# Patient Record
Sex: Female | Born: 1960 | Race: White | Hispanic: No | Marital: Single | Smoking: Former smoker
Health system: Southern US, Community
[De-identification: ages and names within clinical notes are randomized; demographics above are authoritative.]

## PROBLEM LIST (undated history)

## (undated) DIAGNOSIS — F329 Major depressive disorder, single episode, unspecified: Secondary | ICD-10-CM

## (undated) DIAGNOSIS — I10 Essential (primary) hypertension: Secondary | ICD-10-CM

## (undated) DIAGNOSIS — F1910 Other psychoactive substance abuse, uncomplicated: Secondary | ICD-10-CM

## (undated) DIAGNOSIS — F32A Depression, unspecified: Secondary | ICD-10-CM

## (undated) DIAGNOSIS — T7840XA Allergy, unspecified, initial encounter: Secondary | ICD-10-CM

## (undated) DIAGNOSIS — K219 Gastro-esophageal reflux disease without esophagitis: Secondary | ICD-10-CM

## (undated) DIAGNOSIS — F419 Anxiety disorder, unspecified: Secondary | ICD-10-CM

## (undated) DIAGNOSIS — M1990 Unspecified osteoarthritis, unspecified site: Secondary | ICD-10-CM

## (undated) DIAGNOSIS — C801 Malignant (primary) neoplasm, unspecified: Secondary | ICD-10-CM

## (undated) DIAGNOSIS — Z5189 Encounter for other specified aftercare: Secondary | ICD-10-CM

## (undated) DIAGNOSIS — B1920 Unspecified viral hepatitis C without hepatic coma: Secondary | ICD-10-CM

## (undated) HISTORY — DX: Anxiety disorder, unspecified: F41.9

## (undated) HISTORY — DX: Unspecified osteoarthritis, unspecified site: M19.90

## (undated) HISTORY — DX: Unspecified viral hepatitis C without hepatic coma: B19.20

## (undated) HISTORY — DX: Major depressive disorder, single episode, unspecified: F32.9

## (undated) HISTORY — DX: Allergy, unspecified, initial encounter: T78.40XA

## (undated) HISTORY — DX: Other psychoactive substance abuse, uncomplicated: F19.10

## (undated) HISTORY — DX: Encounter for other specified aftercare: Z51.89

## (undated) HISTORY — PX: CHOLECYSTECTOMY: SHX55

## (undated) HISTORY — DX: Malignant (primary) neoplasm, unspecified: C80.1

## (undated) HISTORY — DX: Gastro-esophageal reflux disease without esophagitis: K21.9

## (undated) HISTORY — DX: Depression, unspecified: F32.A

---

## 1992-08-17 DIAGNOSIS — C801 Malignant (primary) neoplasm, unspecified: Secondary | ICD-10-CM

## 1992-08-17 HISTORY — DX: Malignant (primary) neoplasm, unspecified: C80.1

## 2015-03-27 ENCOUNTER — Telehealth: Payer: Self-pay | Admitting: Family Medicine

## 2015-04-15 ENCOUNTER — Ambulatory Visit: Payer: Self-pay | Admitting: Family Medicine

## 2015-04-17 ENCOUNTER — Encounter (INDEPENDENT_AMBULATORY_CARE_PROVIDER_SITE_OTHER): Payer: Self-pay | Admitting: *Deleted

## 2015-04-17 ENCOUNTER — Encounter: Payer: Self-pay | Admitting: Family Medicine

## 2015-04-17 ENCOUNTER — Ambulatory Visit (INDEPENDENT_AMBULATORY_CARE_PROVIDER_SITE_OTHER): Payer: Medicaid Other | Admitting: Family Medicine

## 2015-04-17 VITALS — BP 186/93 | HR 69 | Temp 98.1°F | Ht 64.0 in | Wt 177.2 lb

## 2015-04-17 DIAGNOSIS — Z0000 Encounter for general adult medical examination without abnormal findings: Secondary | ICD-10-CM | POA: Diagnosis not present

## 2015-04-17 DIAGNOSIS — I10 Essential (primary) hypertension: Secondary | ICD-10-CM | POA: Diagnosis not present

## 2015-04-17 DIAGNOSIS — Z1211 Encounter for screening for malignant neoplasm of colon: Secondary | ICD-10-CM | POA: Diagnosis not present

## 2015-04-17 DIAGNOSIS — F3170 Bipolar disorder, currently in remission, most recent episode unspecified: Secondary | ICD-10-CM | POA: Diagnosis not present

## 2015-04-17 DIAGNOSIS — Z1231 Encounter for screening mammogram for malignant neoplasm of breast: Secondary | ICD-10-CM | POA: Insufficient documentation

## 2015-04-17 DIAGNOSIS — B86 Scabies: Secondary | ICD-10-CM

## 2015-04-17 DIAGNOSIS — Z13220 Encounter for screening for lipoid disorders: Secondary | ICD-10-CM | POA: Diagnosis not present

## 2015-04-17 DIAGNOSIS — Z01419 Encounter for gynecological examination (general) (routine) without abnormal findings: Secondary | ICD-10-CM | POA: Diagnosis not present

## 2015-04-17 DIAGNOSIS — F319 Bipolar disorder, unspecified: Secondary | ICD-10-CM | POA: Insufficient documentation

## 2015-04-17 MED ORDER — TRIAMCINOLONE ACETONIDE 0.1 % EX CREA: 1.0000 "application " | TOPICAL_CREAM | Freq: Two times a day (BID) | CUTANEOUS | Status: DC

## 2015-04-17 MED ORDER — HYDROCHLOROTHIAZIDE 25 MG PO TABS: 25.0000 mg | ORAL_TABLET | Freq: Every day | ORAL | Status: DC

## 2015-04-17 MED ORDER — PERMETHRIN 5 % EX CREA: 1.0000 "application " | TOPICAL_CREAM | Freq: Once | CUTANEOUS | Status: DC

## 2015-04-17 NOTE — Patient Instructions (Signed)

## 2015-04-17 NOTE — Assessment & Plan Note (Signed)
Patient has multiple small scabbed over lesions on her body that are very pruritic. She has had scabies before and would like treatment for such. This is not a typical scabies presentation based on the location but will treat and then also recommended her to treat her house for bedbugs.

## 2015-04-17 NOTE — Assessment & Plan Note (Addendum)
Patient has manic depression and anxiety. Gave her number for behavioral science to go and see them. Not currently manic and no current thoughts of depression or suicide.

## 2015-04-17 NOTE — Assessment & Plan Note (Signed)
Patient coming for routine well woman exam. She has had atypical Paps in the past but none of her recent ones have been atypical. She also had cryotherapy and cone biopsy in the past but this was when she was late 83.

## 2015-04-17 NOTE — Progress Notes (Signed)
BP 186/93 mmHg  Pulse 69  Temp(Src) 98.1 F (36.7 C) (Oral)  Ht '5\' 4"'  (1.626 m)  Wt 177 lb 3.2 oz (80.377 kg)  BMI 30.40 kg/m2   Subjective:    Patient ID: Alicia Harmon, female    DOB: 04/12/1961, 54 y.o.   MRN: [id number]  HPI: Alicia Harmon is a 55 y.o. female presenting on 04/17/2015 for Annual Exam   HPI Annual well exam Patient presents today new patient coming today for her annual physical exam. She has had multiple normal Paps in a row but did have cold cone biopsy and cryo-when she was younger. She has not had any abnormality since that point.  Rash Patient has rash that appears like multiple small excoriated spots or bites on her arms and buttocks and legs. She has had treatment once before for scabies for this and it improved but it is since worsened. She does not know if she has bug bites or anything along those lines.  Hypertension Patient has elevated blood pressure today. She has been on blood pressure medication before just the lisinopril and she had a reaction to it of feeling sick to her stomach. Her blood pressure is 186/93. She denies any headaches, chest pain, shortness of breath, visual disturbances, or numbness or weakness.  Mood disorder Patient has been diagnosed with manic depression previously which runs in her family. She is not currently on any medications and denies any major issues with mania or depression. She denies any troubles with the law, relationship issues or any other major issues from this mood disorder. She does say that she does have some anxiety and would like to talk about that.  Relevant past medical, surgical, family and social history reviewed and updated as indicated. Interim medical history since our last visit reviewed. Allergies and medications reviewed and updated.  Review of Systems  Constitutional: Negative for fever and chills.  HENT: Negative for congestion, ear discharge and ear pain.   Eyes: Negative for pain,  redness and visual disturbance.  Respiratory: Negative for chest tightness, shortness of breath and wheezing.   Cardiovascular: Negative for chest pain, palpitations and leg swelling.  Genitourinary: Negative for dysuria and difficulty urinating.  Musculoskeletal: Negative for back pain and gait problem.  Skin: Positive for rash.  Neurological: Negative for dizziness, speech difficulty, weakness, light-headedness, numbness and headaches.  Psychiatric/Behavioral: Negative for suicidal ideas, behavioral problems and agitation. The patient is nervous/anxious.   All other systems reviewed and are negative.   Per HPI unless specifically indicated above     Medication List       This list is accurate as of: 04/17/15  1:20 PM.  Always use your most recent med list.               hydrochlorothiazide 25 MG tablet  Commonly known as:  HYDRODIURIL  Take 1 tablet (25 mg total) by mouth daily.     permethrin 5 % cream  Commonly known as:  ACTICIN  Apply 1 application topically once.     triamcinolone cream 0.1 %  Commonly known as:  KENALOG  Apply 1 application topically 2 (two) times daily.           Objective:    BP 186/93 mmHg  Pulse 69  Temp(Src) 98.1 F (36.7 C) (Oral)  Ht '5\' 4"'  (1.626 m)  Wt 177 lb 3.2 oz (80.377 kg)  BMI 30.40 kg/m2  Wt Readings from Last 3 Encounters:  04/17/15 177 lb 3.2 oz (80.377  kg)    Physical Exam  Constitutional: She is oriented to person, place, and time. She appears well-developed and well-nourished. No distress.  Eyes: Conjunctivae and EOM are normal. Pupils are equal, round, and reactive to light.  Neck: No thyromegaly present.  Cardiovascular: Normal rate, regular rhythm, normal heart sounds and intact distal pulses.   No murmur heard. Pulmonary/Chest: Effort normal and breath sounds normal. No respiratory distress. She has no wheezes.  Musculoskeletal: Normal range of motion. She exhibits no edema or tenderness.  Lymphadenopathy:     She has no cervical adenopathy.  Neurological: She is alert and oriented to person, place, and time. Coordination normal.  Skin: Skin is warm and dry. Rash noted. She is not diaphoretic. No erythema.  Psychiatric: Her speech is normal and behavior is normal. Judgment and thought content normal. Her mood appears anxious. Her affect is not blunt, not labile and not inappropriate. She does not exhibit a depressed mood.  Vitals reviewed.   No results found for this or any previous visit.    Assessment & Plan:   Problem List Items Addressed This Visit      Cardiovascular and Mediastinum   Essential hypertension - Primary    Has had hypertension before and minimal medications for it but is not currently on any medication. Blood pressure is very elevated today and will start hydrochlorothiazide and do some screening labs.      Relevant Medications   hydrochlorothiazide (HYDRODIURIL) 25 MG tablet   Other Relevant Orders   BMP8+EGFR     Musculoskeletal and Integument   Scabies    Patient has multiple small scabbed over lesions on her body that are very pruritic. She has had scabies before and would like treatment for such. This is not a typical scabies presentation based on the location but will treat and then also recommended her to treat her house for bedbugs.      Relevant Medications   permethrin (ACTICIN) 5 % cream   triamcinolone cream (KENALOG) 0.1 %     Other   Well woman exam with routine gynecological exam    Patient coming for routine well woman exam. She has had atypical Paps in the past but none of her recent ones have been atypical. She also had cryotherapy and cone biopsy in the past but this was when she was late 85.      Relevant Orders   Pap IG w/ reflex to HPV when ASC-U   Manic depression    Patient has manic depression and anxiety. Gave her number for behavioral science to go and see them. Not currently manic and no current thoughts of depression or suicide.        Encounter for screening mammogram for breast cancer   Relevant Orders   MM Digital Screening   Colon cancer screening   Relevant Orders   Ambulatory referral to Gastroenterology    Other Visit Diagnoses    Screening for lipoid disorders        Relevant Orders    Lipid panel        Follow up plan: Return in about 4 weeks (around 05/15/2015), or if symptoms worsen or fail to improve.  Caryl Pina, MD [facility] 04/17/2015, 1:20 PM

## 2015-04-17 NOTE — Assessment & Plan Note (Signed)
Has had hypertension before and minimal medications for it but is not currently on any medication. Blood pressure is very elevated today and will start hydrochlorothiazide and do some screening labs.

## 2015-04-18 ENCOUNTER — Other Ambulatory Visit: Payer: Self-pay | Admitting: Family Medicine

## 2015-04-18 DIAGNOSIS — Z1231 Encounter for screening mammogram for malignant neoplasm of breast: Secondary | ICD-10-CM

## 2015-04-18 LAB — BMP8+EGFR
BUN/Creatinine Ratio: 19 (ref 9–23)
BUN: 15 mg/dL (ref 6–24)
CALCIUM: 9.8 mg/dL (ref 8.7–10.2)
CHLORIDE: 103 mmol/L (ref 97–108)
CO2: 23 mmol/L (ref 18–29)
Creatinine, Ser: 0.79 mg/dL (ref 0.57–1.00)
GFR calc Af Amer: 100 mL/min/{1.73_m2} (ref >59)
GFR, EST NON AFRICAN AMERICAN: 86 mL/min/{1.73_m2} (ref >59)
GLUCOSE: 97 mg/dL (ref 65–99)
POTASSIUM: 5 mmol/L (ref 3.5–5.2)
SODIUM: 140 mmol/L (ref 134–144)

## 2015-04-18 LAB — LIPID PANEL
CHOL/HDL RATIO: 3.3 ratio (ref 0.0–4.4)
CHOLESTEROL TOTAL: 166 mg/dL (ref 100–199)
HDL: 51 mg/dL (ref >39)
LDL Calculated: 97 mg/dL (ref 0–99)
TRIGLYCERIDES: 91 mg/dL (ref 0–149)
VLDL Cholesterol Cal: 18 mg/dL (ref 5–40)

## 2015-04-20 LAB — PAP IG W/ RFLX HPV ASCU: PAP Smear Comment: 0

## 2015-04-25 ENCOUNTER — Ambulatory Visit (HOSPITAL_COMMUNITY): Payer: Self-pay

## 2015-05-06 ENCOUNTER — Ambulatory Visit (HOSPITAL_COMMUNITY): Payer: Self-pay

## 2015-05-06 ENCOUNTER — Ambulatory Visit (HOSPITAL_COMMUNITY)
Admission: RE | Admit: 2015-05-06 | Discharge: 2015-05-06 | Disposition: A | Discharge status: Home / Self Care | Payer: Medicaid Other | Source: Ambulatory Visit | Attending: Family Medicine | Admitting: Family Medicine

## 2015-05-06 DIAGNOSIS — Z1231 Encounter for screening mammogram for malignant neoplasm of breast: Secondary | ICD-10-CM | POA: Insufficient documentation

## 2015-05-15 ENCOUNTER — Ambulatory Visit (INDEPENDENT_AMBULATORY_CARE_PROVIDER_SITE_OTHER): Payer: Medicaid Other | Admitting: Family Medicine

## 2015-05-15 ENCOUNTER — Encounter: Payer: Self-pay | Admitting: Family Medicine

## 2015-05-15 VITALS — BP 146/97 | HR 65 | Temp 97.8°F | Ht 64.0 in | Wt 174.8 lb

## 2015-05-15 DIAGNOSIS — W57XXXA Bitten or stung by nonvenomous insect and other nonvenomous arthropods, initial encounter: Secondary | ICD-10-CM

## 2015-05-15 DIAGNOSIS — I10 Essential (primary) hypertension: Secondary | ICD-10-CM | POA: Diagnosis not present

## 2015-05-15 DIAGNOSIS — S45302A Unspecified injury of superficial vein at shoulder and upper arm level, left arm, initial encounter: Secondary | ICD-10-CM | POA: Diagnosis not present

## 2015-05-15 MED ORDER — TRIAMCINOLONE ACETONIDE 0.025 % EX OINT: 1.0000 "application " | TOPICAL_OINTMENT | Freq: Two times a day (BID) | CUTANEOUS | Status: DC

## 2015-05-15 MED ORDER — AMLODIPINE BESYLATE 5 MG PO TABS: 5.0000 mg | ORAL_TABLET | Freq: Every day | ORAL | Status: DC

## 2015-05-15 NOTE — Progress Notes (Signed)
BP 146/97 mmHg  Pulse 65  Temp(Src) 97.8 F (36.6 C) (Oral)  Ht _0  (1.626 m)  Wt 174 lb 12.8 oz (79.289 kg)  BMI 29.99 kg/m2   Subjective:    Patient ID: Alicia Harmon, female    DOB: Jan 05, 1961, 54 y.o.   MRN: [id number]  HPI: Alicia Harmon is a 54 y.o. female presenting on 05/15/2015 for Hypertension and Rash   HPI Hypertension Patient comes in today for hypertension recheck. She is currently taking hydrochlorothiazide 25 mg without any side effects. Patient denies headaches, blurred vision, chest pains, shortness of breath, or weakness. Denies any side effects from medication and is content with current medication. Her blood pressure still not under control today and she is agreeable to taking an additional medication.  Bug bites Patient presents today's with still having issues with bug bites on her body. She has multiple over her shoulder on the left side anterior chest and abdomen box and her lower extremities as well and both arms as well. She has a lot of difficulty not scratching them. They are very pruritic. She denies any fevers or chills and thinks they are lice as she had her daughter living with her who has lived on the street for quite some time.  Relevant past medical, surgical, family and social history reviewed and updated as indicated. Interim medical history since our last visit reviewed. Allergies and medications reviewed and updated.  Review of Systems  Constitutional: Negative for fever and chills.  HENT: Negative for congestion, ear discharge and ear pain.   Eyes: Negative for redness and visual disturbance.  Respiratory: Negative for chest tightness and shortness of breath.   Cardiovascular: Negative for chest pain and leg swelling.  Genitourinary: Negative for dysuria and difficulty urinating.  Musculoskeletal: Negative for back pain and gait problem.  Skin: Positive for rash.  Neurological: Positive for headaches. Negative for dizziness,  weakness, light-headedness and numbness.  Psychiatric/Behavioral: Negative for behavioral problems and agitation.  All other systems reviewed and are negative.   Per HPI unless specifically indicated above     Medication List       This list is accurate as of: 05/15/15  1:21 PM.  Always use your most recent med list.               amLODipine 5 MG tablet  Commonly known as:  NORVASC  Take 1 tablet (5 mg total) by mouth daily.     hydrochlorothiazide 25 MG tablet  Commonly known as:  HYDRODIURIL  Take 1 tablet (25 mg total) by mouth daily.     triamcinolone 0.025 % ointment  Commonly known as:  KENALOG  Apply 1 application topically 2 (two) times daily.           Objective:    BP 146/97 mmHg  Pulse 65  Temp(Src) 97.8 F (36.6 C) (Oral)  Ht _1  (1.626 m)  Wt 174 lb 12.8 oz (79.289 kg)  BMI 29.99 kg/m2  Wt Readings from Last 3 Encounters:  05/15/15 174 lb 12.8 oz (79.289 kg)  04/17/15 177 lb 3.2 oz (80.377 kg)    Physical Exam  Constitutional: She is oriented to person, place, and time. She appears well-developed and well-nourished. No distress.  Eyes: Conjunctivae and EOM are normal. Pupils are equal, round, and reactive to light.  Neck: Neck supple. No thyromegaly present.  Cardiovascular: Normal rate, regular rhythm, normal heart sounds and intact distal pulses.   No murmur heard. Pulmonary/Chest: Effort normal and breath  sounds normal. No respiratory distress. She has no wheezes.  Musculoskeletal: Normal range of motion. She exhibits no edema or tenderness.  Lymphadenopathy:    She has no cervical adenopathy.  Neurological: She is alert and oriented to person, place, and time. Coordination normal.  Skin: Skin is warm and dry. Rash (multiple excoriated sites over her body especially on anterior chest arms and legs. Appear to have been bites for which she has scratched off., No signs of infection) noted. She is not diaphoretic.  Psychiatric: She has a normal  mood and affect. Her behavior is normal.  Vitals reviewed.   Results for orders placed or performed in visit on 04/17/15  [facility]  Result Value Ref Range   Glucose 97 65 - 99 mg/dL   BUN 15 6 - 24 mg/dL   Creatinine, Ser 0.79 0.57 - 1.00 mg/dL   GFR calc non Af Amer 86 >59 mL/min/1.73   GFR calc Af Amer 100 >59 mL/min/1.73   BUN/Creatinine Ratio 19 9 - 23   Sodium 140 134 - 144 mmol/L   Potassium 5.0 3.5 - 5.2 mmol/L   Chloride 103 97 - 108 mmol/L   CO2 23 18 - 29 mmol/L   Calcium 9.8 8.7 - 10.2 mg/dL  Lipid panel  Result Value Ref Range   Cholesterol, Total 166 100 - 199 mg/dL   Triglycerides 91 0 - 149 mg/dL   HDL 51 >39 mg/dL   VLDL Cholesterol Cal 18 5 - 40 mg/dL   LDL Calculated 97 0 - 99 mg/dL   Chol/HDL Ratio 3.3 0.0 - 4.4 ratio units  Pap IG w/ reflex to HPV when ASC-U  Result Value Ref Range   DIAGNOSIS: Comment    Specimen adequacy: Comment    CLINICIAN PROVIDED ICD10: Comment    Performed by: Comment    PAP SMEAR COMMENT .    Note: Comment    Test Methodology Comment    PAP REFLEX: Comment       Assessment & Plan:   Problem List Items Addressed This Visit      Cardiovascular and Mediastinum   Essential hypertension - Primary    Patient has blood pressure that is still not under control despite being on hydrochlorothiazide 25 mg. We will add Norvasc 5 mg and recheck in 1 month.      Relevant Medications   amLODipine (NORVASC) 5 MG tablet    Other Visit Diagnoses    Bug bites        Patient has multiple excoriated sites that used to be bug bites where she has scratched them and made them a lot bigger. We'll try anti-itch cream    Relevant Medications    triamcinolone (KENALOG) 0.025 % ointment        Follow up plan: Return in about 4 weeks (around 06/12/2015), or if symptoms worsen or fail to improve, for hypertension.  Caryl Pina, MD [facility] 05/15/2015, 1:21 PM

## 2015-05-15 NOTE — Assessment & Plan Note (Signed)
Patient has blood pressure that is still not under control despite being on hydrochlorothiazide 25 mg. We will add Norvasc 5 mg and recheck in 1 month.

## 2015-06-12 ENCOUNTER — Ambulatory Visit: Payer: Medicaid Other | Admitting: Family Medicine

## 2015-06-18 ENCOUNTER — Other Ambulatory Visit: Payer: Self-pay | Admitting: Family Medicine

## 2015-08-08 ENCOUNTER — Telehealth: Payer: Self-pay

## 2015-08-08 ENCOUNTER — Ambulatory Visit (INDEPENDENT_AMBULATORY_CARE_PROVIDER_SITE_OTHER): Payer: Medicaid Other | Admitting: Family Medicine

## 2015-08-08 ENCOUNTER — Encounter: Payer: Self-pay | Admitting: Family Medicine

## 2015-08-08 VITALS — BP 130/89 | HR 82 | Temp 97.8°F | Ht 64.0 in | Wt 186.0 lb

## 2015-08-08 DIAGNOSIS — R21 Rash and other nonspecific skin eruption: Secondary | ICD-10-CM

## 2015-08-08 DIAGNOSIS — Z1211 Encounter for screening for malignant neoplasm of colon: Secondary | ICD-10-CM

## 2015-08-08 DIAGNOSIS — I10 Essential (primary) hypertension: Secondary | ICD-10-CM

## 2015-08-08 DIAGNOSIS — Z202 Contact with and (suspected) exposure to infections with a predominantly sexual mode of transmission: Secondary | ICD-10-CM | POA: Diagnosis not present

## 2015-08-08 MED ORDER — HYDROCORTISONE VALERATE 0.2 % EX OINT: 1.0000 "application " | TOPICAL_OINTMENT | Freq: Two times a day (BID) | CUTANEOUS | Status: DC

## 2015-08-08 MED ORDER — HYDROCORTISONE 1 % EX OINT: 1.0000 "application " | TOPICAL_OINTMENT | Freq: Two times a day (BID) | CUTANEOUS | Status: DC

## 2015-08-08 NOTE — Assessment & Plan Note (Signed)
Monitor for now and consider diet controlled.
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
## 2015-08-08 NOTE — Telephone Encounter (Signed)
Pt notified of RX 

## 2015-08-08 NOTE — Telephone Encounter (Signed)
Medicaid non  Preferred Hydrocortisone  Preferred are fluticasone cream/ointment, hydrocortisone valerate cream/ointment or mometasone cream/ointment

## 2015-08-08 NOTE — Telephone Encounter (Signed)
Please send the hydrocortisone valerate ointment, same dosing. Caryl Pina, MD [facility] 08/08/2015, 1:04 PM

## 2015-08-08 NOTE — Addendum Note (Signed)
Addended by: Marin Olp on: 08/08/2015 02:27 PM   Modules accepted: Orders

## 2015-08-09 ENCOUNTER — Other Ambulatory Visit: Payer: Self-pay | Admitting: Family Medicine

## 2015-08-09 LAB — STD SCREEN (8)
HEP A IGM: NEGATIVE
HEP B S AG: NEGATIVE
HIV Screen 4th Generation wRfx: NONREACTIVE
HSV 1 Glycoprotein G Ab, IgG: 51.2 index — ABNORMAL HIGH (ref 0.00–0.90)
Hep B C IgM: NEGATIVE
Hep C Virus Ab: >11 s/co ratio — ABNORMAL HIGH (ref 0.0–0.9)
RPR: NONREACTIVE

## 2015-08-09 LAB — HEPATIC FUNCTION PANEL
ALBUMIN: 4.1 g/dL (ref 3.5–5.5)
ALK PHOS: 76 IU/L (ref 39–117)
ALT: 181 IU/L — ABNORMAL HIGH (ref 0–32)
AST: 165 IU/L — ABNORMAL HIGH (ref 0–40)
BILIRUBIN, DIRECT: 0.23 mg/dL (ref 0.00–0.40)
Bilirubin Total: 0.7 mg/dL (ref 0.0–1.2)
TOTAL PROTEIN: 8.2 g/dL (ref 6.0–8.5)

## 2015-08-10 LAB — GC/CHLAMYDIA PROBE AMP
CHLAMYDIA, DNA PROBE: NEGATIVE
NEISSERIA GONORRHOEAE BY PCR: NEGATIVE

## 2015-08-14 ENCOUNTER — Telehealth: Payer: Self-pay | Admitting: *Deleted

## 2015-08-14 NOTE — Telephone Encounter (Signed)
Aware of lab results. Appointment schedule for recheck of rash on legs and back and need for dermatology referral.

## 2015-08-21 ENCOUNTER — Ambulatory Visit (INDEPENDENT_AMBULATORY_CARE_PROVIDER_SITE_OTHER): Payer: Medicaid Other | Admitting: Family Medicine

## 2015-08-21 ENCOUNTER — Encounter: Payer: Self-pay | Admitting: Family Medicine

## 2015-08-21 ENCOUNTER — Other Ambulatory Visit: Payer: Medicaid Other

## 2015-08-21 VITALS — BP 135/79 | HR 72 | Temp 97.8°F | Ht 64.0 in | Wt 180.6 lb

## 2015-08-21 DIAGNOSIS — R894 Abnormal immunological findings in specimens from other organs, systems and tissues: Secondary | ICD-10-CM

## 2015-08-21 DIAGNOSIS — R768 Other specified abnormal immunological findings in serum: Secondary | ICD-10-CM

## 2015-08-21 DIAGNOSIS — B0089 Other herpesviral infection: Secondary | ICD-10-CM | POA: Diagnosis not present

## 2015-08-21 MED ORDER — VALACYCLOVIR HCL 1 G PO TABS: 1000.0000 mg | ORAL_TABLET | Freq: Two times a day (BID) | ORAL | Status: DC

## 2015-08-21 NOTE — Progress Notes (Signed)
BP 135/79 mmHg  Pulse 72  Temp(Src) 97.8 F (36.6 C) (Oral)  Ht 5\' 4"  (1.626 m)  Wt 180 lb 9.6 oz (81.92 kg)  BMI 30.98 kg/m2   Subjective:    Patient ID: Alicia Harmon, female    DOB: 07-08-1961, 55 y.o.   MRN: [id number]  HPI: Alicia Harmon is a 55 y.o. female presenting on 08/21/2015 for Labwork for herpes and Discuss Hepatitis C   HPI Rash She continues to have this rash on her buttocks and posterior leg basically small ulcerations that are very pruritic but are not really having any drainage never really come to ahead and liked that. There is no erythema surrounding them and they have really not changed to much except that the triamcinolone treatment helps them go away and then they come back. Patient has a referral to dermatology  Hepatitis C antibody positive Patient had a hepatitis C antibody positive and needs the RNA quantitative.  Relevant past medical, surgical, family and social history reviewed and updated as indicated. Interim medical history since our last visit reviewed. Allergies and medications reviewed and updated.  Review of Systems  Constitutional: Negative for fever and chills.  HENT: Negative for congestion, ear discharge and ear pain.   Eyes: Negative for redness and visual disturbance.  Respiratory: Negative for chest tightness and shortness of breath.   Cardiovascular: Negative for chest pain and leg swelling.  Genitourinary: Negative for dysuria, urgency, frequency, hematuria, flank pain, vaginal discharge, difficulty urinating, genital sores, vaginal pain and pelvic pain.  Musculoskeletal: Negative for back pain and gait problem.  Skin: Positive for rash.  Neurological: Negative for light-headedness and headaches.  Psychiatric/Behavioral: Negative for behavioral problems and agitation.  All other systems reviewed and are negative.   Per HPI unless specifically indicated above     Medication List       This list is accurate as of:  08/21/15 12:03 PM.  Always use your most recent med list.               hydrocortisone 1 % ointment  Apply 1 application topically 2 (two) times daily.     hydrocortisone valerate ointment 0.2 %  Commonly known as:  WEST-CORT  Apply 1 application topically 2 (two) times daily.     triamcinolone 0.025 % ointment  Commonly known as:  KENALOG  APPLY TOPICALLY TWICE DAILY     valACYclovir 1000 MG tablet  Commonly known as:  VALTREX  Take 1 tablet (1,000 mg total) by mouth 2 (two) times daily.           Objective:    BP 135/79 mmHg  Pulse 72  Temp(Src) 97.8 F (36.6 C) (Oral)  Ht 5\' 4"  (1.626 m)  Wt 180 lb 9.6 oz (81.92 kg)  BMI 30.98 kg/m2  Wt Readings from Last 3 Encounters:  08/21/15 180 lb 9.6 oz (81.92 kg)  08/08/15 186 lb (84.369 kg)  05/15/15 174 lb 12.8 oz (79.289 kg)    Physical Exam  Constitutional: She is oriented to person, place, and time. She appears well-developed and well-nourished. No distress.  Eyes: Conjunctivae and EOM are normal. Pupils are equal, round, and reactive to light.  Neck: Neck supple. No thyromegaly present.  Cardiovascular: Normal rate, regular rhythm, normal heart sounds and intact distal pulses.   No murmur heard. Pulmonary/Chest: Effort normal and breath sounds normal. No respiratory distress. She has no wheezes.  Musculoskeletal: Normal range of motion. She exhibits no edema or tenderness.  Lymphadenopathy:  She has no cervical adenopathy.  Neurological: She is alert and oriented to person, place, and time. Coordination normal.  Skin: Skin is warm and dry. Rash noted. Rash is maculopapular (Scattered maculopapular spots over her arms and torso and legs. No erythema or drainage.). She is not diaphoretic.  Psychiatric: She has a normal mood and affect. Her behavior is normal.  Nursing note and vitals reviewed.   Results for orders placed or performed in visit on 08/08/15  GC/Chlamydia Probe Amp  Result Value Ref Range    Chlamydia trachomatis, NAA Negative Negative   Neisseria gonorrhoeae by PCR Negative Negative  STD Screen (8)  Result Value Ref Range   Hep A IgM Negative Negative   Hepatitis B Surface Ag Negative Negative   Hep B C IgM Negative Negative   Hep C Virus Ab >11.0 (H) 0.0 - 0.9 s/co ratio   RPR Ser Ql Non Reactive Non Reactive   HIV Screen 4th Generation wRfx Non Reactive Non Reactive   HSV 1 Glycoprotein G Ab, IgG 51.20 (H) 0.00 - 0.90 index   HSV 2 Glycoprotein G Ab, IgG >23.60 (H) 0.00 - 0.90 index  Hepatic function panel  Result Value Ref Range   Total Protein 8.2 6.0 - 8.5 g/dL   Albumin 4.1 3.5 - 5.5 g/dL   Bilirubin Total 0.7 0.0 - 1.2 mg/dL   Bilirubin, Direct 0.23 0.00 - 0.40 mg/dL   Alkaline Phosphatase 76 39 - 117 IU/L   AST 165 (H) 0 - 40 IU/L   ALT 181 (H) 0 - 32 IU/L      Assessment & Plan:   Problem List Items Addressed This Visit    None    Visit Diagnoses    Hepatitis C antibody test positive    -  Primary    Relevant Orders    HCV RNA quant    Herpes dermatitis        Relevant Medications    valACYclovir (VALTREX) 1000 MG tablet        Follow up plan: Return if symptoms worsen or fail to improve.  Counseling provided for all of the vaccine components Orders Placed This Encounter  Procedures  . HCV RNA quant    Caryl Pina, MD [facility] 08/21/2015, 12:03 PM

## 2015-08-22 LAB — HCV RNA QUANT
HCV log10: 5.883 log10 IU/mL
Hepatitis C Quantitation: 764000 IU/mL

## 2015-08-23 NOTE — Addendum Note (Signed)
Addended by: Thana Ates on: 08/23/2015 08:26 AM   Modules accepted: Orders

## 2015-09-09 ENCOUNTER — Other Ambulatory Visit: Payer: Self-pay | Admitting: Family Medicine

## 2015-09-09 DIAGNOSIS — B0089 Other herpesviral infection: Secondary | ICD-10-CM

## 2015-09-09 MED ORDER — VALACYCLOVIR HCL 1 G PO TABS: 1000.0000 mg | ORAL_TABLET | Freq: Two times a day (BID) | ORAL | Status: DC

## 2015-09-09 NOTE — Telephone Encounter (Signed)
Left detailed message stating rx has been sent to pharmacy and to [facility] with any further questions or concerns.

## 2015-09-09 NOTE — Telephone Encounter (Signed)
Okay to send refill for Valtrex. Caryl Pina, MD [facility] 09/09/2015, 10:49 AM

## 2015-09-25 ENCOUNTER — Other Ambulatory Visit: Payer: Self-pay | Admitting: Family Medicine

## 2015-09-25 DIAGNOSIS — B0089 Other herpesviral infection: Secondary | ICD-10-CM

## 2015-09-25 MED ORDER — VALACYCLOVIR HCL 1 G PO TABS: 1000.0000 mg | ORAL_TABLET | Freq: Two times a day (BID) | ORAL | Status: DC

## 2015-10-09 ENCOUNTER — Other Ambulatory Visit: Payer: Self-pay | Admitting: Family Medicine

## 2015-10-09 ENCOUNTER — Telehealth: Payer: Self-pay | Admitting: Family Medicine

## 2015-10-09 DIAGNOSIS — B0089 Other herpesviral infection: Secondary | ICD-10-CM

## 2015-10-09 MED ORDER — VALACYCLOVIR HCL 1 G PO TABS: 1000.0000 mg | ORAL_TABLET | Freq: Two times a day (BID) | ORAL | Status: DC

## 2015-10-09 NOTE — Telephone Encounter (Signed)
Please advise 

## 2015-10-21 ENCOUNTER — Other Ambulatory Visit: Payer: Self-pay | Admitting: Family Medicine

## 2015-10-21 DIAGNOSIS — B0089 Other herpesviral infection: Secondary | ICD-10-CM

## 2015-10-21 MED ORDER — TRIAMCINOLONE ACETONIDE 0.025 % EX OINT: TOPICAL_OINTMENT | Freq: Two times a day (BID) | CUTANEOUS | Status: DC

## 2015-10-21 MED ORDER — VALACYCLOVIR HCL 1 G PO TABS: 1000.0000 mg | ORAL_TABLET | Freq: Two times a day (BID) | ORAL | Status: DC

## 2015-10-21 NOTE — Telephone Encounter (Signed)
Prescriptions sent to pharmacy

## 2015-10-24 ENCOUNTER — Other Ambulatory Visit (HOSPITAL_COMMUNITY): Payer: Self-pay | Admitting: Nurse Practitioner

## 2015-10-24 DIAGNOSIS — R945 Abnormal results of liver function studies: Secondary | ICD-10-CM

## 2015-10-24 DIAGNOSIS — B182 Chronic viral hepatitis C: Secondary | ICD-10-CM

## 2015-10-24 DIAGNOSIS — R7989 Other specified abnormal findings of blood chemistry: Secondary | ICD-10-CM

## 2015-11-01 LAB — POCT URINE SPECIFIC GRAVITY, REFRACTOMETER

## 2015-11-08 ENCOUNTER — Ambulatory Visit: Payer: Medicaid Other | Admitting: Family Medicine

## 2015-11-12 ENCOUNTER — Ambulatory Visit (HOSPITAL_COMMUNITY): Payer: Medicaid Other

## 2015-11-14 ENCOUNTER — Ambulatory Visit: Payer: Medicaid Other | Admitting: Family Medicine

## 2015-11-18 ENCOUNTER — Ambulatory Visit (INDEPENDENT_AMBULATORY_CARE_PROVIDER_SITE_OTHER): Payer: Medicaid Other | Admitting: Family Medicine

## 2015-11-18 ENCOUNTER — Encounter: Payer: Self-pay | Admitting: Family Medicine

## 2015-11-18 VITALS — BP 130/84 | HR 87 | Temp 98.2°F | Ht 64.0 in | Wt 182.0 lb

## 2015-11-18 DIAGNOSIS — R300 Dysuria: Secondary | ICD-10-CM

## 2015-11-18 DIAGNOSIS — R21 Rash and other nonspecific skin eruption: Secondary | ICD-10-CM | POA: Diagnosis not present

## 2015-11-18 DIAGNOSIS — Z1211 Encounter for screening for malignant neoplasm of colon: Secondary | ICD-10-CM | POA: Diagnosis not present

## 2015-11-18 DIAGNOSIS — A5903 Trichomonal cystitis and urethritis: Secondary | ICD-10-CM

## 2015-11-18 LAB — URINALYSIS, COMPLETE
Bilirubin, UA: NEGATIVE
Glucose, UA: NEGATIVE
Ketones, UA: NEGATIVE
NITRITE UA: NEGATIVE
PH UA: 6.5 (ref 5.0–7.5)
PROTEIN UA: NEGATIVE
Specific Gravity, UA: 1.015 (ref 1.005–1.030)
Urobilinogen, Ur: 1 mg/dL (ref 0.2–1.0)

## 2015-11-18 LAB — MICROSCOPIC EXAMINATION: Epithelial Cells (non renal): >10 /hpf — AB (ref 0–10)

## 2015-11-18 MED ORDER — METRONIDAZOLE 500 MG PO TABS
500.0000 mg | ORAL_TABLET | Freq: Two times a day (BID) | ORAL | Status: DC
Start: 2015-11-18 — End: 2016-02-27

## 2015-11-18 MED ORDER — MELOXICAM 15 MG PO TABS: 15.0000 mg | ORAL_TABLET | Freq: Every day | ORAL | Status: DC

## 2015-11-18 NOTE — Progress Notes (Signed)
BP 130/84 mmHg  Pulse 87  Temp(Src) 98.2 F (36.8 C) (Oral)  Ht 5\' 4"  (1.626 m)  Wt 182 lb (82.555 kg)  BMI 31.22 kg/m2   Subjective:    Patient ID: Alicia Harmon, female    DOB: 1961/07/20, 55 y.o.   MRN: [id number]  HPI: Alicia Harmon is a 55 y.o. female presenting on 11/18/2015 for Urinary Tract Infection and Back Pain   HPI UTI follow-up and back pain Patient was at [facility] on 11/01/2015 and was diagnosed with urinary tract infection and given Keflex 500 mg twice a day.She still feels like she is having worsening back pain on the right lower side and still having dysuria and it didn't really get better with the Keflex. She is also been taking ibuprofen like candy. She denies any fevers or chills or hematuria.  Rash Patient has had a recurrent rash that is scattered over her body. The small bumps that look like bites and then she picks out on the day but up and bleed. She also runs up with scabs because of these. I believe she was supposed to seen a dermatologist once before but do not recall whether she actually did or not and she cannot remember as she is not a great historian. She denies any fevers or chills or redness or warmth associated with the sites.  Relevant past medical, surgical, family and social history reviewed and updated as indicated. Interim medical history since our last visit reviewed. Allergies and medications reviewed and updated.  Review of Systems  Constitutional: Negative for fever and chills.  HENT: Negative for congestion, ear discharge and ear pain.   Eyes: Negative for redness and visual disturbance.  Respiratory: Negative for chest tightness and shortness of breath.   Cardiovascular: Negative for chest pain and leg swelling.  Gastrointestinal: Negative for abdominal pain.  Genitourinary: Positive for dysuria and flank pain. Negative for urgency, hematuria, decreased urine volume, vaginal bleeding, vaginal discharge, difficulty  urinating and vaginal pain.  Musculoskeletal: Negative for back pain and gait problem.  Skin: Positive for rash.  Neurological: Negative for light-headedness and headaches.  Psychiatric/Behavioral: Negative for behavioral problems and agitation.  All other systems reviewed and are negative.   Per HPI unless specifically indicated above     Medication List       This list is accurate as of: 11/18/15  9:42 AM.  Always use your most recent med list.               meloxicam 15 MG tablet  Commonly known as:  MOBIC  Take 1 tablet (15 mg total) by mouth daily. Take with food     metroNIDAZOLE 500 MG tablet  Commonly known as:  FLAGYL  Take 1 tablet (500 mg total) by mouth 2 (two) times daily.     triamcinolone 0.025 % ointment  Commonly known as:  KENALOG  Apply topically 2 (two) times daily.     valACYclovir 1000 MG tablet  Commonly known as:  VALTREX  Take 1 tablet (1,000 mg total) by mouth 2 (two) times daily.           Objective:    BP 130/84 mmHg  Pulse 87  Temp(Src) 98.2 F (36.8 C) (Oral)  Ht 5\' 4"  (1.626 m)  Wt 182 lb (82.555 kg)  BMI 31.22 kg/m2  Wt Readings from Last 3 Encounters:  11/18/15 182 lb (82.555 kg)  08/21/15 180 lb 9.6 oz (81.92 kg)  08/08/15 186 lb (84.369 kg)  Physical Exam  Constitutional: She is oriented to person, place, and time. She appears well-developed and well-nourished. No distress.  Eyes: Conjunctivae and EOM are normal. Pupils are equal, round, and reactive to light.  Neck: Neck supple. No thyromegaly present.  Cardiovascular: Normal rate, regular rhythm, normal heart sounds and intact distal pulses.   No murmur heard. Pulmonary/Chest: Effort normal and breath sounds normal. No respiratory distress. She has no wheezes.  Abdominal: Soft. Bowel sounds are normal. She exhibits no distension. There is no tenderness. There is CVA tenderness (right). There is no rebound.  Musculoskeletal: Normal range of motion. She exhibits no  edema or tenderness.  Lymphadenopathy:    She has no cervical adenopathy.  Neurological: She is alert and oriented to person, place, and time. Coordination normal.  Skin: Skin is warm and dry. Rash noted. Rash is papular (Small papules scattered over body, some are scabbed over after her picking them off.). She is not diaphoretic.  Psychiatric: She has a normal mood and affect. Her behavior is normal.  Nursing note and vitals reviewed.     Assessment & Plan:   Problem List Items Addressed This Visit    None    Visit Diagnoses    Special screening for malignant neoplasms, colon    -  Primary    Relevant Medications    metroNIDAZOLE (FLAGYL) 500 MG tablet    meloxicam (MOBIC) 15 MG tablet    Other Relevant Orders    Ambulatory referral to Gastroenterology    Dysuria        Relevant Medications    metroNIDAZOLE (FLAGYL) 500 MG tablet    meloxicam (MOBIC) 15 MG tablet    Other Relevant Orders    Urinalysis, Complete    Trichomonal cystitis and urethritis        Relevant Medications    metroNIDAZOLE (FLAGYL) 500 MG tablet    meloxicam (MOBIC) 15 MG tablet    Rash        Patient has recurrent rash that is fine papules over her body. We'll send to dermatology again.    Relevant Orders    Ambulatory referral to Dermatology        Follow up plan: Return if symptoms worsen or fail to improve.  Counseling provided for all of the vaccine components Orders Placed This Encounter  Procedures  . Urinalysis, Complete  . Ambulatory referral to Gastroenterology    Caryl Pina, MD [facility] 11/18/2015, 9:42 AM

## 2015-12-03 ENCOUNTER — Other Ambulatory Visit: Payer: Self-pay | Admitting: Family Medicine

## 2015-12-03 ENCOUNTER — Telehealth: Payer: Self-pay | Admitting: Family Medicine

## 2015-12-03 ENCOUNTER — Telehealth: Payer: Self-pay

## 2015-12-03 DIAGNOSIS — B0089 Other herpesviral infection: Secondary | ICD-10-CM

## 2015-12-03 DIAGNOSIS — B182 Chronic viral hepatitis C: Secondary | ICD-10-CM

## 2015-12-03 DIAGNOSIS — F3170 Bipolar disorder, currently in remission, most recent episode unspecified: Secondary | ICD-10-CM

## 2015-12-03 DIAGNOSIS — R945 Abnormal results of liver function studies: Secondary | ICD-10-CM

## 2015-12-03 DIAGNOSIS — R7989 Other specified abnormal findings of blood chemistry: Secondary | ICD-10-CM

## 2015-12-03 MED ORDER — VALACYCLOVIR HCL 1 G PO TABS: 1000.0000 mg | ORAL_TABLET | Freq: Two times a day (BID) | ORAL | Status: DC

## 2015-12-03 NOTE — Telephone Encounter (Signed)
Patient calling and stating she needs liver US set up  (I see that she was scheduled for one through [facility] and I guess she didn't go)  Also she says they say she needs a psychiatrist appt.

## 2015-12-03 NOTE — Telephone Encounter (Signed)
done

## 2015-12-04 NOTE — Telephone Encounter (Signed)
Called and informed patient that referrals have been made.  Patient states that she was in excrutiating pain and could barely walk to the bathroom.  Offered patient an appt for today 4/19 here at the office.  Patient states that she can not get here.  Informed patient to call EMS and go to the ER if she is in that much pain

## 2015-12-04 NOTE — Telephone Encounter (Signed)
I'm okay with setting up the right upper quadrant ultrasound again. Also okay with doing psychiatric referral.

## 2015-12-05 ENCOUNTER — Encounter: Payer: Self-pay | Admitting: Family Medicine

## 2015-12-11 ENCOUNTER — Ambulatory Visit (HOSPITAL_COMMUNITY): Admission: RE | Admit: 2015-12-11 | Payer: Medicaid Other | Source: Ambulatory Visit

## 2015-12-12 ENCOUNTER — Telehealth (HOSPITAL_COMMUNITY): Payer: Self-pay | Admitting: *Deleted

## 2015-12-19 ENCOUNTER — Ambulatory Visit (HOSPITAL_COMMUNITY): Admission: RE | Admit: 2015-12-19 | Payer: Medicaid Other | Source: Ambulatory Visit

## 2016-01-22 ENCOUNTER — Ambulatory Visit: Payer: Medicaid Other | Admitting: Family Medicine

## 2016-02-27 ENCOUNTER — Ambulatory Visit (INDEPENDENT_AMBULATORY_CARE_PROVIDER_SITE_OTHER): Payer: Medicaid Other | Admitting: Family Medicine

## 2016-02-27 ENCOUNTER — Encounter: Payer: Self-pay | Admitting: Family Medicine

## 2016-02-27 VITALS — BP 151/88 | HR 59 | Temp 97.5°F | Ht 64.0 in | Wt 166.2 lb

## 2016-02-27 DIAGNOSIS — B0089 Other herpesviral infection: Secondary | ICD-10-CM

## 2016-02-27 DIAGNOSIS — M4646 Discitis, unspecified, lumbar region: Secondary | ICD-10-CM | POA: Diagnosis not present

## 2016-02-27 MED ORDER — TRIAMCINOLONE ACETONIDE 0.025 % EX OINT: TOPICAL_OINTMENT | Freq: Two times a day (BID) | CUTANEOUS | Status: DC

## 2016-02-27 MED ORDER — VALACYCLOVIR HCL 1 G PO TABS: 1000.0000 mg | ORAL_TABLET | Freq: Two times a day (BID) | ORAL | Status: DC

## 2016-02-27 NOTE — Progress Notes (Signed)
BP 151/88 mmHg  Pulse 59  Temp(Src) 97.5 F (36.4 C) (Oral)  Ht 5\' 4"  (1.626 m)  Wt 166 lb 3.2 oz (75.388 kg)  BMI 28.51 kg/m2   Subjective:    Patient ID: Alicia Harmon, female    DOB: 24-Dec-1960, 55 y.o.   MRN: [id number]  HPI: Alicia Harmon is a 55 y.o. female presenting on 02/27/2016 for Hospitalization Follow-up   HPI Patient is coming in for hospital follow-up for discitis and abscess on her iliopsoas  Patient was in the hospital starting on May 1 for infection and abscess in her lumbar spine in the disc and on her iliopsoas. She was treated for a month with IV antibiotics and then another month in rehabilitation with IV antibiotics. She is just getting out from that recently. She continues to follow-up with infectious disease is now on oral antibiotics. Today she is still having some low-grade back pain but denies any abdominal pain or any pain radiating anywhere else. She also denies any fevers or chills. A drain was stocking on her right side draining the disc area that was infected but that has since been removed and the area where the drain was is been healed over well. Patient does want a refill on her Valtrex for her herpes dermatitis which is actually doing very well and controlled currently.   Relevant past medical, surgical, family and social history reviewed and updated as indicated. Interim medical history since our last visit reviewed. Allergies and medications reviewed and updated.  Review of Systems  Constitutional: Negative for fever and chills.  HENT: Negative for congestion, ear discharge and ear pain.   Eyes: Negative for redness and visual disturbance.  Respiratory: Negative for chest tightness and shortness of breath.   Cardiovascular: Negative for chest pain and leg swelling.  Genitourinary: Negative for dysuria and difficulty urinating.  Musculoskeletal: Positive for back pain. Negative for gait problem.  Skin: Negative for rash.  Neurological:  Negative for light-headedness and headaches.  Psychiatric/Behavioral: Negative for behavioral problems and agitation.  All other systems reviewed and are negative.   Per HPI unless specifically indicated above     Medication List       This list is accurate as of: 02/27/16 11:11 AM.  Always use your most recent med list.               ALPRAZolam 0.25 MG tablet  Commonly known as:  XANAX  Take by mouth.     cephALEXin 500 MG capsule  Commonly known as:  KEFLEX  Take by mouth.     lidocaine 5 %  Commonly known as:  Mower onto the skin.     meloxicam 15 MG tablet  Commonly known as:  MOBIC  Take 1 tablet (15 mg total) by mouth daily. Take with food     oxyCODONE 5 MG immediate release tablet  Commonly known as:  Oxy IR/ROXICODONE  Take by mouth.     pantoprazole 40 MG tablet  Commonly known as:  PROTONIX  Take by mouth.     triamcinolone 0.025 % ointment  Commonly known as:  KENALOG  Apply topically 2 (two) times daily.     valACYclovir 1000 MG tablet  Commonly known as:  VALTREX  Take 1 tablet (1,000 mg total) by mouth 2 (two) times daily.           Objective:    BP 151/88 mmHg  Pulse 59  Temp(Src) 97.5 F (36.4 C) (Oral)  Ht  5\' 4"  (1.626 m)  Wt 166 lb 3.2 oz (75.388 kg)  BMI 28.51 kg/m2  Wt Readings from Last 3 Encounters:  02/27/16 166 lb 3.2 oz (75.388 kg)  11/18/15 182 lb (82.555 kg)  08/21/15 180 lb 9.6 oz (81.92 kg)    Physical Exam  Constitutional: She is oriented to person, place, and time. She appears well-developed and well-nourished. No distress.  Eyes: Conjunctivae and EOM are normal. Pupils are equal, round, and reactive to light.  Cardiovascular: Normal rate, regular rhythm, normal heart sounds and intact distal pulses.   No murmur heard. Pulmonary/Chest: Effort normal and breath sounds normal. No respiratory distress. She has no wheezes.  Musculoskeletal: Normal range of motion. She exhibits tenderness. She exhibits no  edema.       Lumbar back: She exhibits tenderness (Mild tenderness in lumbar region. Drain site appears to be well-healed). She exhibits normal range of motion and no bony tenderness.  Neurological: She is alert and oriented to person, place, and time. Coordination normal.  Skin: Skin is warm and dry. No rash noted. She is not diaphoretic.  Psychiatric: She has a normal mood and affect. Her behavior is normal.  Nursing note and vitals reviewed.      Assessment & Plan:   Problem List Items Addressed This Visit    None    Visit Diagnoses    Herpes dermatitis    -  Primary    Relevant Medications    cephALEXin (KEFLEX) 500 MG capsule    valACYclovir (VALTREX) 1000 MG tablet    Discitis of lumbar region        infectious, is seeing ID and is still on antibiotics    Relevant Medications    oxyCODONE (OXY IR/ROXICODONE) 5 MG immediate release tablet        Follow up plan: Return if symptoms worsen or fail to improve.  Counseling provided for all of the vaccine components No orders of the defined types were placed in this encounter.    Caryl Pina, MD [facility] 02/27/2016, 11:11 AM

## 2016-03-17 ENCOUNTER — Other Ambulatory Visit: Payer: Self-pay | Admitting: Family Medicine

## 2016-03-17 DIAGNOSIS — B0089 Other herpesviral infection: Secondary | ICD-10-CM

## 2016-03-18 MED ORDER — VALACYCLOVIR HCL 1 G PO TABS: 1000.0000 mg | ORAL_TABLET | Freq: Two times a day (BID) | ORAL | 0 refills | Status: DC

## 2016-03-21 ENCOUNTER — Other Ambulatory Visit: Payer: Self-pay | Admitting: Family Medicine

## 2016-04-14 ENCOUNTER — Telehealth: Payer: Self-pay | Admitting: Family Medicine

## 2016-04-14 DIAGNOSIS — B0089 Other herpesviral infection: Secondary | ICD-10-CM

## 2016-04-14 MED ORDER — TRIAMCINOLONE ACETONIDE 0.025 % EX OINT: TOPICAL_OINTMENT | CUTANEOUS | 0 refills | Status: DC

## 2016-04-14 MED ORDER — VALACYCLOVIR HCL 1 G PO TABS: 1000.0000 mg | ORAL_TABLET | Freq: Two times a day (BID) | ORAL | 0 refills | Status: DC

## 2016-04-14 NOTE — Telephone Encounter (Signed)
Patient aware that rx sent to pharmacy. 

## 2016-05-08 ENCOUNTER — Telehealth: Payer: Self-pay | Admitting: Family Medicine

## 2016-05-08 ENCOUNTER — Other Ambulatory Visit: Payer: Self-pay

## 2016-05-08 DIAGNOSIS — B0089 Other herpesviral infection: Secondary | ICD-10-CM

## 2016-05-08 MED ORDER — VALACYCLOVIR HCL 1 G PO TABS: 1000.0000 mg | ORAL_TABLET | Freq: Two times a day (BID) | ORAL | 0 refills | Status: DC

## 2016-05-08 NOTE — Telephone Encounter (Signed)
done

## 2016-06-03 ENCOUNTER — Telehealth: Payer: Self-pay | Admitting: Family Medicine

## 2016-06-03 DIAGNOSIS — B0089 Other herpesviral infection: Secondary | ICD-10-CM

## 2016-06-04 MED ORDER — VALACYCLOVIR HCL 1 G PO TABS: 1000.0000 mg | ORAL_TABLET | Freq: Two times a day (BID) | ORAL | 0 refills | Status: DC

## 2016-06-04 MED ORDER — TRIAMCINOLONE ACETONIDE 0.025 % EX OINT: TOPICAL_OINTMENT | CUTANEOUS | 0 refills | Status: DC

## 2016-06-04 NOTE — Telephone Encounter (Signed)
Left detailed message stating requested rx were sent to pharmacy and to call back with any further questions.

## 2016-07-07 ENCOUNTER — Telehealth: Payer: Self-pay | Admitting: Family Medicine

## 2016-07-07 DIAGNOSIS — B0089 Other herpesviral infection: Secondary | ICD-10-CM

## 2016-07-07 MED ORDER — VALACYCLOVIR HCL 1 G PO TABS: 1000.0000 mg | ORAL_TABLET | Freq: Two times a day (BID) | ORAL | 0 refills | Status: DC

## 2016-07-07 NOTE — Telephone Encounter (Signed)
RX sent to pharmacy  

## 2016-08-03 ENCOUNTER — Other Ambulatory Visit: Payer: Self-pay | Admitting: Family Medicine

## 2016-08-03 DIAGNOSIS — B0089 Other herpesviral infection: Secondary | ICD-10-CM

## 2016-08-03 MED ORDER — VALACYCLOVIR HCL 1 G PO TABS: 1000.0000 mg | ORAL_TABLET | Freq: Two times a day (BID) | ORAL | 0 refills | Status: DC

## 2016-08-03 NOTE — Telephone Encounter (Signed)
Go ahead and refill medication

## 2016-08-03 NOTE — Addendum Note (Signed)
Addended by: Jamelle Haring on: 08/03/2016 11:34 AM   Modules accepted: Orders

## 2016-08-03 NOTE — Telephone Encounter (Signed)
Pt aware refill has been sent to pharmacy

## 2016-08-21 ENCOUNTER — Other Ambulatory Visit: Payer: Self-pay | Admitting: Family Medicine

## 2016-08-21 DIAGNOSIS — B0089 Other herpesviral infection: Secondary | ICD-10-CM

## 2016-08-21 MED ORDER — TRIAMCINOLONE ACETONIDE 0.025 % EX OINT: TOPICAL_OINTMENT | CUTANEOUS | 0 refills | Status: DC

## 2016-08-21 MED ORDER — VALACYCLOVIR HCL 1 G PO TABS
1000.0000 mg | ORAL_TABLET | Freq: Two times a day (BID) | ORAL | 0 refills | Status: DC
Start: 2016-08-21 — End: 2016-09-07

## 2016-08-21 NOTE — Telephone Encounter (Signed)
Patient aware.

## 2016-08-21 NOTE — Telephone Encounter (Signed)
Patient requesting refills on triamcinolone and valtrex. Please advise.

## 2016-09-04 ENCOUNTER — Telehealth: Payer: Self-pay | Admitting: Family Medicine

## 2016-09-04 DIAGNOSIS — B0089 Other herpesviral infection: Secondary | ICD-10-CM

## 2016-09-07 ENCOUNTER — Telehealth: Payer: Self-pay | Admitting: Family Medicine

## 2016-09-07 MED ORDER — VALACYCLOVIR HCL 1 G PO TABS: 1000.0000 mg | ORAL_TABLET | Freq: Two times a day (BID) | ORAL | 2 refills | Status: DC

## 2016-09-07 NOTE — Telephone Encounter (Signed)
Done by Reginia Forts

## 2016-09-07 NOTE — Telephone Encounter (Signed)
I do not remember saying that specifically but sure as go ahead and send 2 refills for her.

## 2016-09-28 ENCOUNTER — Other Ambulatory Visit: Payer: Self-pay | Admitting: Family Medicine

## 2016-09-28 DIAGNOSIS — B0089 Other herpesviral infection: Secondary | ICD-10-CM

## 2016-10-09 ENCOUNTER — Telehealth: Payer: Self-pay | Admitting: Family Medicine

## 2016-10-09 DIAGNOSIS — B0089 Other herpesviral infection: Secondary | ICD-10-CM

## 2016-10-09 MED ORDER — VALACYCLOVIR HCL 1 G PO TABS: 1000.0000 mg | ORAL_TABLET | Freq: Two times a day (BID) | ORAL | 1 refills | Status: DC

## 2016-10-09 NOTE — Telephone Encounter (Signed)
What is the name of the medication? valtrex  Have you contacted your pharmacy to request a refill? no  Which pharmacy would you like this sent to? [facility] in [place].   Patient notified that their request is being sent to the clinical staff for review and that they should receive a call once it is complete. If they do not receive a call within 24 hours they can check with their pharmacy or our office.

## 2016-10-09 NOTE — Telephone Encounter (Signed)
Done for #20, NTBS

## 2016-11-02 ENCOUNTER — Other Ambulatory Visit: Payer: Self-pay | Admitting: Family Medicine

## 2016-11-02 DIAGNOSIS — B0089 Other herpesviral infection: Secondary | ICD-10-CM

## 2016-11-03 MED ORDER — VALACYCLOVIR HCL 1 G PO TABS: 1000.0000 mg | ORAL_TABLET | Freq: Two times a day (BID) | ORAL | 0 refills | Status: DC

## 2016-11-03 NOTE — Telephone Encounter (Signed)
done

## 2016-11-09 ENCOUNTER — Encounter: Payer: Self-pay | Admitting: Family Medicine

## 2016-11-09 ENCOUNTER — Ambulatory Visit (INDEPENDENT_AMBULATORY_CARE_PROVIDER_SITE_OTHER): Payer: Medicaid Other | Admitting: Family Medicine

## 2016-11-09 VITALS — BP 139/86 | HR 86 | Temp 97.8°F | Ht 64.0 in | Wt 179.0 lb

## 2016-11-09 DIAGNOSIS — B0089 Other herpesviral infection: Secondary | ICD-10-CM | POA: Diagnosis not present

## 2016-11-09 DIAGNOSIS — Z13220 Encounter for screening for lipoid disorders: Secondary | ICD-10-CM

## 2016-11-09 DIAGNOSIS — Z131 Encounter for screening for diabetes mellitus: Secondary | ICD-10-CM

## 2016-11-09 MED ORDER — TRIAMCINOLONE ACETONIDE 0.025 % EX OINT: TOPICAL_OINTMENT | CUTANEOUS | 3 refills | Status: DC

## 2016-11-09 MED ORDER — VALACYCLOVIR HCL 1 G PO TABS: 1000.0000 mg | ORAL_TABLET | Freq: Two times a day (BID) | ORAL | 3 refills | Status: DC

## 2016-11-09 NOTE — Progress Notes (Addendum)
BP 139/86   Pulse 86   Temp 97.8 F (36.6 C) (Oral)   Ht '5\' 4"'  (1.626 m)   Wt 179 lb (81.2 kg)   BMI 30.73 kg/m    Subjective:    Patient ID: Alicia Harmon, female    DOB: 07-05-1961, 56 y.o.   MRN: [id number]  HPI: Alicia Harmon is a 56 y.o. female presenting on 11/09/2016 for Medication Refill   HPI Recheck on skin rash Patient is coming today for recheck on the skin rash that she has had recurrently. She is taking the Valtrex and using triamcinolone cream and says that they are working very well at keeping the rash at [place]. She only occasionally has gotten a little spot here and there but for the most part it keeps it controlled and at [place]. She still does have the scarring from all the picking that she did previously but she denies any new rash or new lesions popping up. She denies any fevers or chills or redness or warmth anywhere drainage.  Relevant past medical, surgical, family and social history reviewed and updated as indicated. Interim medical history since our last visit reviewed. Allergies and medications reviewed and updated.  Review of Systems  Constitutional: Negative for chills and fever.  Respiratory: Negative for chest tightness and shortness of breath.   Cardiovascular: Negative for chest pain and leg swelling.  Musculoskeletal: Negative for back pain and gait problem.  Skin: Positive for rash.  Neurological: Negative for light-headedness and headaches.  Psychiatric/Behavioral: Negative for agitation and behavioral problems.  All other systems reviewed and are negative.   Per HPI unless specifically indicated above        Objective:    BP 139/86   Pulse 86   Temp 97.8 F (36.6 C) (Oral)   Ht '5\' 4"'  (1.626 m)   Wt 179 lb (81.2 kg)   BMI 30.73 kg/m   Wt Readings from Last 3 Encounters:  11/09/16 179 lb (81.2 kg)  02/27/16 166 lb 3.2 oz (75.4 kg)  11/18/15 182 lb (82.6 kg)    Physical Exam  Constitutional: She is oriented to person, place,  and time. She appears well-developed and well-nourished. No distress.  Eyes: Conjunctivae are normal.  Cardiovascular: Normal rate, regular rhythm, normal heart sounds and intact distal pulses.   No murmur heard. Pulmonary/Chest: Effort normal and breath sounds normal. No respiratory distress. She has no wheezes.  Musculoskeletal: Normal range of motion. She exhibits no edema or tenderness.  Neurological: She is alert and oriented to person, place, and time. Coordination normal.  Skin: Skin is warm and dry. No rash (No visible rash today, patient just has scarring spots from where she is picked previously.) noted. She is not diaphoretic.  Psychiatric: She has a normal mood and affect. Her behavior is normal.  Nursing note and vitals reviewed.     Assessment & Plan:   Problem List Items Addressed This Visit    None    Visit Diagnoses    Herpes dermatitis    -  Primary   Relevant Medications   valACYclovir (VALTREX) 1000 MG tablet   triamcinolone (KENALOG) 0.025 % ointment   Lipid screening       Relevant Orders   Lipid panel   Diabetes mellitus screening       Relevant Orders   CMP14+EGFR       Follow up plan: Return in about 1 year (around 11/09/2017), or if symptoms worsen or fail to improve.  Counseling provided for  all of the vaccine components No orders of the defined types were placed in this encounter.   Caryl Pina, MD [facility] 11/09/2016, 1:20 PM

## 2016-11-09 NOTE — Addendum Note (Signed)
Addended by: Caryl Pina on: 11/09/2016 02:10 PM   Modules accepted: Orders

## 2016-11-10 LAB — LIPID PANEL
Chol/HDL Ratio: 2.4 ratio units (ref 0.0–4.4)
Cholesterol, Total: 181 mg/dL (ref 100–199)
HDL: 76 mg/dL (ref >39)
LDL Calculated: 90 mg/dL (ref 0–99)
TRIGLYCERIDES: 74 mg/dL (ref 0–149)
VLDL Cholesterol Cal: 15 mg/dL (ref 5–40)

## 2016-11-10 LAB — CMP14+EGFR
A/G RATIO: 1.2 (ref 1.2–2.2)
ALT: 31 IU/L (ref 0–32)
AST: 36 IU/L (ref 0–40)
Albumin: 4.6 g/dL (ref 3.5–5.5)
Alkaline Phosphatase: 79 IU/L (ref 39–117)
BUN/Creatinine Ratio: 25 — ABNORMAL HIGH (ref 9–23)
BUN: 19 mg/dL (ref 6–24)
Bilirubin Total: 1 mg/dL (ref 0.0–1.2)
CALCIUM: 9.4 mg/dL (ref 8.7–10.2)
CHLORIDE: 103 mmol/L (ref 96–106)
CO2: 20 mmol/L (ref 18–29)
Creatinine, Ser: 0.76 mg/dL (ref 0.57–1.00)
GFR calc Af Amer: 104 mL/min/{1.73_m2} (ref >59)
GFR calc non Af Amer: 90 mL/min/{1.73_m2} (ref >59)
GLOBULIN, TOTAL: 3.8 g/dL (ref 1.5–4.5)
Glucose: 84 mg/dL (ref 65–99)
POTASSIUM: 4.1 mmol/L (ref 3.5–5.2)
SODIUM: 143 mmol/L (ref 134–144)
TOTAL PROTEIN: 8.4 g/dL (ref 6.0–8.5)

## 2017-01-01 ENCOUNTER — Other Ambulatory Visit: Payer: Self-pay | Admitting: Family Medicine

## 2017-01-01 DIAGNOSIS — B0089 Other herpesviral infection: Secondary | ICD-10-CM

## 2017-01-15 ENCOUNTER — Other Ambulatory Visit: Payer: Self-pay | Admitting: Family Medicine

## 2017-01-15 DIAGNOSIS — B0089 Other herpesviral infection: Secondary | ICD-10-CM

## 2017-02-01 ENCOUNTER — Other Ambulatory Visit: Payer: Self-pay | Admitting: Family Medicine

## 2017-02-01 DIAGNOSIS — B0089 Other herpesviral infection: Secondary | ICD-10-CM

## 2017-03-01 ENCOUNTER — Other Ambulatory Visit: Payer: Self-pay | Admitting: Family Medicine

## 2017-03-01 DIAGNOSIS — B0089 Other herpesviral infection: Secondary | ICD-10-CM

## 2017-03-16 ENCOUNTER — Other Ambulatory Visit: Payer: Self-pay | Admitting: Family Medicine

## 2017-03-16 DIAGNOSIS — B0089 Other herpesviral infection: Secondary | ICD-10-CM

## 2017-03-29 ENCOUNTER — Other Ambulatory Visit: Payer: Self-pay | Admitting: Family Medicine

## 2017-03-29 DIAGNOSIS — B0089 Other herpesviral infection: Secondary | ICD-10-CM

## 2017-04-16 ENCOUNTER — Other Ambulatory Visit: Payer: Self-pay | Admitting: Family Medicine

## 2017-04-16 DIAGNOSIS — B0089 Other herpesviral infection: Secondary | ICD-10-CM

## 2017-04-20 NOTE — Telephone Encounter (Signed)
Last seen 11/09/16

## 2017-05-17 ENCOUNTER — Other Ambulatory Visit: Payer: Self-pay | Admitting: Family Medicine

## 2017-05-17 DIAGNOSIS — B0089 Other herpesviral infection: Secondary | ICD-10-CM

## 2017-08-20 ENCOUNTER — Other Ambulatory Visit: Payer: Self-pay | Admitting: Family Medicine

## 2017-08-20 DIAGNOSIS — B0089 Other herpesviral infection: Secondary | ICD-10-CM

## 2017-11-09 ENCOUNTER — Other Ambulatory Visit: Payer: Self-pay | Admitting: Family Medicine

## 2017-11-09 DIAGNOSIS — B0089 Other herpesviral infection: Secondary | ICD-10-CM

## 2018-01-11 ENCOUNTER — Other Ambulatory Visit: Payer: Self-pay | Admitting: Family Medicine

## 2018-01-11 DIAGNOSIS — B0089 Other herpesviral infection: Secondary | ICD-10-CM

## 2018-01-21 ENCOUNTER — Other Ambulatory Visit: Payer: Self-pay | Admitting: Family Medicine

## 2018-01-21 DIAGNOSIS — B0089 Other herpesviral infection: Secondary | ICD-10-CM

## 2018-02-15 ENCOUNTER — Other Ambulatory Visit: Payer: Self-pay | Admitting: Family Medicine

## 2018-02-15 DIAGNOSIS — B0089 Other herpesviral infection: Secondary | ICD-10-CM

## 2018-02-16 ENCOUNTER — Ambulatory Visit (INDEPENDENT_AMBULATORY_CARE_PROVIDER_SITE_OTHER): Payer: Medicaid Other | Admitting: Family Medicine

## 2018-02-16 ENCOUNTER — Encounter: Payer: Self-pay | Admitting: Family Medicine

## 2018-02-16 VITALS — BP 154/91 | HR 85 | Temp 98.0°F | Ht 66.0 in | Wt 189.8 lb

## 2018-02-16 DIAGNOSIS — B0089 Other herpesviral infection: Secondary | ICD-10-CM

## 2018-02-16 DIAGNOSIS — K2100 Gastro-esophageal reflux disease with esophagitis, without bleeding: Secondary | ICD-10-CM

## 2018-02-16 DIAGNOSIS — K219 Gastro-esophageal reflux disease without esophagitis: Secondary | ICD-10-CM | POA: Insufficient documentation

## 2018-02-16 DIAGNOSIS — Z0000 Encounter for general adult medical examination without abnormal findings: Secondary | ICD-10-CM

## 2018-02-16 DIAGNOSIS — E669 Obesity, unspecified: Secondary | ICD-10-CM

## 2018-02-16 DIAGNOSIS — I10 Essential (primary) hypertension: Secondary | ICD-10-CM

## 2018-02-16 MED ORDER — OMEPRAZOLE 20 MG PO CPDR: 20.0000 mg | DELAYED_RELEASE_CAPSULE | Freq: Every day | ORAL | 3 refills | Status: DC

## 2018-02-16 MED ORDER — CHLORTHALIDONE 25 MG PO TABS: 25.0000 mg | ORAL_TABLET | Freq: Every day | ORAL | 3 refills | Status: DC

## 2018-02-16 MED ORDER — VALACYCLOVIR HCL 1 G PO TABS: 1000.0000 mg | ORAL_TABLET | Freq: Two times a day (BID) | ORAL | 3 refills | Status: DC

## 2018-02-16 NOTE — Progress Notes (Signed)
BP (!) 154/91   Pulse 85   Temp 98 F (36.7 C) (Oral)   Ht 5' 6" (1.676 m)   Wt 189 lb 12.8 oz (86.1 kg)   BMI 30.63 kg/m    Subjective:    Patient ID: Alicia Harmon, female    DOB: 01/27/1961, 57 y.o.   MRN: [id number]  HPI: Alicia Harmon is a 57 y.o. female presenting on 02/16/2018 for Annual Exam and Gastroesophageal Reflux (Patient would like rx for)   HPI Well adult exam and physical Patient is coming in today for well adult exam and physical.  She has been feeling some dizziness and felt like her blood pressure was up and her blood pressure today is 154/91 and that is what she wanted to come in to get checked out. Patient denies any chest pain, shortness of breath, headaches or vision issues, abdominal complaints, diarrhea, nausea, vomiting, or joint issues.  Patient needs a refill  Relevant past medical, surgical, family and social history reviewed and updated as indicated. Interim medical history since our last visit reviewed. Allergies and medications reviewed and updated.  Review of Systems  Constitutional: Negative for chills and fever.  HENT: Negative for congestion, ear discharge, ear pain and tinnitus.   Eyes: Negative for pain, redness and visual disturbance.  Respiratory: Negative for cough, chest tightness, shortness of breath and wheezing.   Cardiovascular: Negative for chest pain, palpitations and leg swelling.  Gastrointestinal: Negative for abdominal pain, blood in stool, constipation and diarrhea.  Genitourinary: Negative for difficulty urinating, dysuria and hematuria.  Musculoskeletal: Negative for back pain, gait problem and myalgias.  Skin: Negative for rash.  Neurological: Negative for dizziness, weakness, light-headedness and headaches.  Psychiatric/Behavioral: Negative for agitation, behavioral problems and suicidal ideas.  All other systems reviewed and are negative.   Per HPI unless specifically indicated above   Allergies as of 02/16/2018       Reactions   Lisinopril Hives      Medication List        Accurate as of 02/16/18  3:17 PM. Always use your most recent med list.          chlorthalidone 25 MG tablet Commonly known as:  HYGROTON Take 1 tablet (25 mg total) by mouth daily.   omeprazole 20 MG capsule Commonly known as:  PRILOSEC Take 1 capsule (20 mg total) by mouth daily.   triamcinolone 0.025 % ointment Commonly known as:  KENALOG APPLY AS DIRECTED TWICE DAILY.   valACYclovir 1000 MG tablet Commonly known as:  VALTREX Take 1 tablet (1,000 mg total) by mouth 2 (two) times daily.          Objective:    BP (!) 154/91   Pulse 85   Temp 98 F (36.7 C) (Oral)   Ht 5' 6" (1.676 m)   Wt 189 lb 12.8 oz (86.1 kg)   BMI 30.63 kg/m   Wt Readings from Last 3 Encounters:  02/16/18 189 lb 12.8 oz (86.1 kg)  11/09/16 179 lb (81.2 kg)  02/27/16 166 lb 3.2 oz (75.4 kg)    Physical Exam  Constitutional: She is oriented to person, place, and time. She appears well-developed and well-nourished. No distress.  Eyes: Pupils are equal, round, and reactive to light. Conjunctivae and EOM are normal.  Neck: Neck supple. No thyromegaly present.  Cardiovascular: Normal rate, regular rhythm, normal heart sounds and intact distal pulses.  No murmur heard. Pulmonary/Chest: Effort normal and breath sounds normal. No respiratory distress. She has no  wheezes.  Abdominal: Soft. Bowel sounds are normal. She exhibits no distension. There is no tenderness. There is no guarding.  Musculoskeletal: Normal range of motion. She exhibits no edema or tenderness.  Lymphadenopathy:    She has no cervical adenopathy.  Neurological: She is alert and oriented to person, place, and time. Coordination normal.  Skin: Skin is warm and dry. No rash noted. She is not diaphoretic.  Psychiatric: She has a normal mood and affect. Her behavior is normal.  Nursing note and vitals reviewed.      Assessment & Plan:   Problem List Items  Addressed This Visit      Cardiovascular and Mediastinum   Essential hypertension   Relevant Medications   chlorthalidone (HYGROTON) 25 MG tablet   Other Relevant Orders   CMP14+EGFR (Completed)     Digestive   GERD (gastroesophageal reflux disease)   Relevant Medications   omeprazole (PRILOSEC) 20 MG capsule   Other Relevant Orders   CBC with Differential/Platelet (Completed)     Other   Obesity (BMI 30.0-34.9)   Relevant Orders   Lipid panel (Completed)    Other Visit Diagnoses    Well adult exam    -  Primary   Relevant Orders   CBC with Differential/Platelet (Completed)   CMP14+EGFR (Completed)   Lipid panel (Completed)   Herpes dermatitis       Relevant Medications   valACYclovir (VALTREX) 1000 MG tablet       Follow up plan: Return in about 1 month (around 03/16/2018), or if symptoms worsen or fail to improve, for htn.  Counseling provided for all of the vaccine components No orders of the defined types were placed in this encounter.   Alicia Pina, MD [facility] 02/16/2018, 3:17 PM

## 2018-02-17 LAB — CMP14+EGFR
ALBUMIN: 4.8 g/dL (ref 3.5–5.5)
ALK PHOS: 61 IU/L (ref 39–117)
ALT: 28 IU/L (ref 0–32)
AST: 31 IU/L (ref 0–40)
Albumin/Globulin Ratio: 1.4 (ref 1.2–2.2)
BUN / CREAT RATIO: 20 (ref 9–23)
BUN: 18 mg/dL (ref 6–24)
Bilirubin Total: 0.7 mg/dL (ref 0.0–1.2)
CO2: 21 mmol/L (ref 20–29)
CREATININE: 0.88 mg/dL (ref 0.57–1.00)
Calcium: 10.3 mg/dL — ABNORMAL HIGH (ref 8.7–10.2)
Chloride: 104 mmol/L (ref 96–106)
GFR calc non Af Amer: 75 mL/min/{1.73_m2} (ref >59)
GFR, EST AFRICAN AMERICAN: 86 mL/min/{1.73_m2} (ref >59)
GLUCOSE: 86 mg/dL (ref 65–99)
Globulin, Total: 3.4 g/dL (ref 1.5–4.5)
Potassium: 4.3 mmol/L (ref 3.5–5.2)
Sodium: 143 mmol/L (ref 134–144)
TOTAL PROTEIN: 8.2 g/dL (ref 6.0–8.5)

## 2018-02-17 LAB — CBC WITH DIFFERENTIAL/PLATELET
Basophils Absolute: 0 10*3/uL (ref 0.0–0.2)
Basos: 0 %
EOS (ABSOLUTE): 0.2 10*3/uL (ref 0.0–0.4)
Eos: 2 %
HEMOGLOBIN: 14.4 g/dL (ref 11.1–15.9)
Hematocrit: 44.1 % (ref 34.0–46.6)
IMMATURE GRANS (ABS): 0 10*3/uL (ref 0.0–0.1)
Immature Granulocytes: 0 %
LYMPHS: 26 %
Lymphocytes Absolute: 2.3 10*3/uL (ref 0.7–3.1)
MCH: 32.2 pg (ref 26.6–33.0)
MCHC: 32.7 g/dL (ref 31.5–35.7)
MCV: 99 fL — ABNORMAL HIGH (ref 79–97)
MONOCYTES: 11 %
Monocytes Absolute: 1 10*3/uL — ABNORMAL HIGH (ref 0.1–0.9)
NEUTROS PCT: 61 %
Neutrophils Absolute: 5.4 10*3/uL (ref 1.4–7.0)
PLATELETS: 194 10*3/uL (ref 150–450)
RBC: 4.47 x10E6/uL (ref 3.77–5.28)
RDW: 13.8 % (ref 12.3–15.4)
WBC: 8.9 10*3/uL (ref 3.4–10.8)

## 2018-02-17 LAB — LIPID PANEL
CHOLESTEROL TOTAL: 202 mg/dL — AB (ref 100–199)
Chol/HDL Ratio: 3.6 ratio (ref 0.0–4.4)
HDL: 56 mg/dL (ref >39)
LDL CALC: 112 mg/dL — AB (ref 0–99)
Triglycerides: 170 mg/dL — ABNORMAL HIGH (ref 0–149)
VLDL CHOLESTEROL CAL: 34 mg/dL (ref 5–40)

## 2018-03-21 ENCOUNTER — Encounter: Payer: Self-pay | Admitting: Family Medicine

## 2018-03-21 ENCOUNTER — Ambulatory Visit: Payer: Medicaid Other | Admitting: Family Medicine

## 2018-03-21 VITALS — BP 116/70 | HR 62 | Temp 98.1°F | Ht 66.0 in | Wt 194.0 lb

## 2018-03-21 DIAGNOSIS — E669 Obesity, unspecified: Secondary | ICD-10-CM | POA: Diagnosis not present

## 2018-03-21 DIAGNOSIS — K2100 Gastro-esophageal reflux disease with esophagitis, without bleeding: Secondary | ICD-10-CM

## 2018-03-21 DIAGNOSIS — E66811 Obesity, class 1: Secondary | ICD-10-CM

## 2018-03-21 DIAGNOSIS — I10 Essential (primary) hypertension: Secondary | ICD-10-CM | POA: Diagnosis not present

## 2018-03-21 MED ORDER — OMEPRAZOLE 20 MG PO CPDR: 20.0000 mg | DELAYED_RELEASE_CAPSULE | Freq: Every day | ORAL | 3 refills | Status: DC

## 2018-03-21 MED ORDER — CHLORTHALIDONE 25 MG PO TABS: 25.0000 mg | ORAL_TABLET | Freq: Every day | ORAL | 3 refills | Status: DC

## 2018-03-21 NOTE — Progress Notes (Signed)
BP 116/70   Pulse 62   Temp 98.1 F (36.7 C) (Oral)   Ht _0  (1.676 m)   Wt 194 lb (88 kg)   BMI 31.31 kg/m    Subjective:    Patient ID: Alicia Harmon, female    DOB: [birth year], 57 y.o.   MRN: [id number]  HPI: Alicia Harmon is a 57 y.o. female presenting on 03/21/2018 for Hypertension (1 month follow up)   HPI Hypertension Patient is currently on chlorthalidone, and their blood pressure today is 116/70. Patient denies any lightheadedness or dizziness. Patient denies headaches, blurred vision, chest pains, shortness of breath, or weakness. Denies any side effects from medication and is content with current medication.   GERD Patient is currently on omeprazole.  She denies any major symptoms or abdominal pain or belching or burping. She denies any blood in her stool or lightheadedness or dizziness.   Relevant past medical, surgical, family and social history reviewed and updated as indicated. Interim medical history since our last visit reviewed. Allergies and medications reviewed and updated.  Review of Systems  Constitutional: Negative for chills and fever.  Eyes: Negative for visual disturbance.  Respiratory: Negative for chest tightness and shortness of breath.   Cardiovascular: Negative for chest pain and leg swelling.  Gastrointestinal: Negative for abdominal pain.  Musculoskeletal: Negative for back pain and gait problem.  Skin: Negative for rash.  Neurological: Negative for dizziness, light-headedness and headaches.  Psychiatric/Behavioral: Negative for agitation and behavioral problems.  All other systems reviewed and are negative.   Per HPI unless specifically indicated above   Allergies as of 03/21/2018      Reactions   Lisinopril Hives      Medication List        Accurate as of 03/21/18  2:08 PM. Always use your most recent med list.          chlorthalidone 25 MG tablet Commonly known as:  HYGROTON Take 1 tablet (25 mg total) by mouth daily.   omeprazole 20 MG capsule Commonly known as:  PRILOSEC Take 1 capsule (20 mg total) by mouth daily.   triamcinolone 0.025 % ointment Commonly known as:  KENALOG APPLY AS DIRECTED TWICE DAILY.   valACYclovir 1000 MG tablet Commonly known as:  VALTREX Take 1 tablet (1,000 mg total) by mouth 2 (two) times daily.          Objective:    BP 116/70   Pulse 62   Temp 98.1 F (36.7 C) (Oral)   Ht _1  (1.676 m)   Wt 194 lb (88 kg)   BMI 31.31 kg/m   Wt Readings from Last 3 Encounters:  03/21/18 194 lb (88 kg)  02/16/18 189 lb 12.8 oz (86.1 kg)  11/09/16 179 lb (81.2 kg)    Physical Exam  Constitutional: She is oriented to person, place, and time. She appears well-developed and well-nourished. No distress.  Eyes: Conjunctivae are normal.  Neck: Neck supple. No thyromegaly present.  Cardiovascular: Normal rate, regular rhythm, normal heart sounds and intact distal pulses.  No murmur heard. Pulmonary/Chest: Effort normal and breath sounds normal. No respiratory distress. She has no wheezes.  Musculoskeletal: Normal range of motion. She exhibits no edema or tenderness.  Lymphadenopathy:    She has no cervical adenopathy.  Neurological: She is alert and oriented to person, place, and time. Coordination normal.  Skin: Skin is warm and dry. No rash noted. She is not diaphoretic.  Psychiatric: She has a normal mood and affect. Her  behavior is normal.  Nursing note and vitals reviewed.   Results for orders placed or performed in visit on 02/16/18  CBC with Differential/Platelet  Result Value Ref Range   WBC 8.9 3.4 - 10.8 x10E3/uL   RBC 4.47 3.77 - 5.28 x10E6/uL   Hemoglobin 14.4 11.1 - 15.9 g/dL   Hematocrit 44.1 34.0 - 46.6 %   MCV 99 (H) 79 - 97 fL   MCH 32.2 26.6 - 33.0 pg   MCHC 32.7 31.5 - 35.7 g/dL   RDW 13.8 12.3 - 15.4 %   Platelets 194 150 - 450 x10E3/uL   Neutrophils 61 Not Estab. %   Lymphs 26 Not Estab. %   Monocytes 11 Not Estab. %   Eos 2 Not Estab. %    Basos 0 Not Estab. %   Neutrophils Absolute 5.4 1.4 - 7.0 x10E3/uL   Lymphocytes Absolute 2.3 0.7 - 3.1 x10E3/uL   Monocytes Absolute 1.0 (H) 0.1 - 0.9 x10E3/uL   EOS (ABSOLUTE) 0.2 0.0 - 0.4 x10E3/uL   Basophils Absolute 0.0 0.0 - 0.2 x10E3/uL   Immature Granulocytes 0 Not Estab. %   Immature Grans (Abs) 0.0 0.0 - 0.1 x10E3/uL  CMP14+EGFR  Result Value Ref Range   Glucose 86 65 - 99 mg/dL   BUN 18 6 - 24 mg/dL   Creatinine, Ser 0.88 0.57 - 1.00 mg/dL   GFR calc non Af Amer 75 >59 mL/min/1.73   GFR calc Af Amer 86 >59 mL/min/1.73   BUN/Creatinine Ratio 20 9 - 23   Sodium 143 134 - 144 mmol/L   Potassium 4.3 3.5 - 5.2 mmol/L   Chloride 104 96 - 106 mmol/L   CO2 21 20 - 29 mmol/L   Calcium 10.3 (H) 8.7 - 10.2 mg/dL   Total Protein 8.2 6.0 - 8.5 g/dL   Albumin 4.8 3.5 - 5.5 g/dL   Globulin, Total 3.4 1.5 - 4.5 g/dL   Albumin/Globulin Ratio 1.4 1.2 - 2.2   Bilirubin Total 0.7 0.0 - 1.2 mg/dL   Alkaline Phosphatase 61 39 - 117 IU/L   AST 31 0 - 40 IU/L   ALT 28 0 - 32 IU/L  Lipid panel  Result Value Ref Range   Cholesterol, Total 202 (H) 100 - 199 mg/dL   Triglycerides 170 (H) 0 - 149 mg/dL   HDL 56 >39 mg/dL   VLDL Cholesterol Cal 34 5 - 40 mg/dL   LDL Calculated 112 (H) 0 - 99 mg/dL   Chol/HDL Ratio 3.6 0.0 - 4.4 ratio      Assessment & Plan:   Problem List Items Addressed This Visit      Cardiovascular and Mediastinum   Essential hypertension - Primary   Relevant Medications   chlorthalidone (HYGROTON) 25 MG tablet   Other Relevant Orders   BMP8+EGFR     Digestive   GERD (gastroesophageal reflux disease)   Relevant Medications   omeprazole (PRILOSEC) 20 MG capsule     Other   Obesity (BMI 30.0-34.9)      Blood pressure looks controlled today, continue chlorthalidone, will check chemistry panel, patient is having a little bit of muscle aches with the heat, told her to watch out for dehydration and making sure she is getting electrolytes. Follow up  plan: Return if symptoms worsen or fail to improve.  Counseling provided for all of the vaccine components Orders Placed This Encounter  Procedures  . BMP8+EGFR    Caryl Pina, MD [facility] 03/21/2018, 2:08 PM

## 2018-03-22 LAB — BMP8+EGFR
BUN/Creatinine Ratio: 14 (ref 9–23)
BUN: 13 mg/dL (ref 6–24)
CO2: 23 mmol/L (ref 20–29)
CREATININE: 0.9 mg/dL (ref 0.57–1.00)
Calcium: 9.4 mg/dL (ref 8.7–10.2)
Chloride: 100 mmol/L (ref 96–106)
GFR, EST AFRICAN AMERICAN: 84 mL/min/{1.73_m2} (ref >59)
GFR, EST NON AFRICAN AMERICAN: 73 mL/min/{1.73_m2} (ref >59)
Glucose: 96 mg/dL (ref 65–99)
Potassium: 3.6 mmol/L (ref 3.5–5.2)
SODIUM: 138 mmol/L (ref 134–144)

## 2018-05-17 ENCOUNTER — Other Ambulatory Visit: Payer: Self-pay | Admitting: Family Medicine

## 2018-05-17 DIAGNOSIS — B0089 Other herpesviral infection: Secondary | ICD-10-CM

## 2018-06-17 ENCOUNTER — Other Ambulatory Visit: Payer: Self-pay | Admitting: Family Medicine

## 2018-06-17 DIAGNOSIS — B0089 Other herpesviral infection: Secondary | ICD-10-CM

## 2018-07-08 ENCOUNTER — Telehealth: Payer: Self-pay | Admitting: Family Medicine

## 2018-08-01 ENCOUNTER — Other Ambulatory Visit: Payer: Self-pay | Admitting: Family Medicine

## 2018-08-01 DIAGNOSIS — B0089 Other herpesviral infection: Secondary | ICD-10-CM

## 2018-08-15 ENCOUNTER — Telehealth: Payer: Self-pay | Admitting: Family Medicine

## 2018-08-15 NOTE — Telephone Encounter (Signed)
Pt aware and scheduled for follow up with Dr Dettinger 1/23 at 2:10.

## 2018-08-15 NOTE — Telephone Encounter (Signed)
Please address

## 2018-08-15 NOTE — Telephone Encounter (Signed)
It is been too long since have seen her to be able to do a referral based on previous information, I would have see her again to assess and then could do the referral based off of that

## 2018-09-06 ENCOUNTER — Ambulatory Visit: Payer: Medicaid Other | Admitting: *Deleted

## 2018-09-06 VITALS — BP 136/88 | HR 79

## 2018-09-06 DIAGNOSIS — Z0130 Encounter for examination of blood pressure without abnormal findings: Secondary | ICD-10-CM

## 2018-09-06 NOTE — Progress Notes (Signed)
Pt requesting BP check BP  156 88 P 82  rck BP 136 88 P 79  Pt has follow up appt with Dr Warrick Parisian 09/23/2018

## 2018-09-08 ENCOUNTER — Ambulatory Visit: Payer: Medicaid Other | Admitting: Family Medicine

## 2018-09-23 ENCOUNTER — Ambulatory Visit: Payer: Medicaid Other | Admitting: Family Medicine

## 2018-09-23 ENCOUNTER — Encounter: Payer: Self-pay | Admitting: Family Medicine

## 2018-09-23 VITALS — BP 137/85 | HR 86 | Temp 97.5°F | Ht 66.0 in | Wt 201.0 lb

## 2018-09-23 DIAGNOSIS — K2100 Gastro-esophageal reflux disease with esophagitis, without bleeding: Secondary | ICD-10-CM

## 2018-09-23 DIAGNOSIS — Z1211 Encounter for screening for malignant neoplasm of colon: Secondary | ICD-10-CM

## 2018-09-23 DIAGNOSIS — I10 Essential (primary) hypertension: Secondary | ICD-10-CM

## 2018-09-23 DIAGNOSIS — E669 Obesity, unspecified: Secondary | ICD-10-CM

## 2018-09-23 DIAGNOSIS — R109 Unspecified abdominal pain: Secondary | ICD-10-CM | POA: Diagnosis not present

## 2018-09-23 DIAGNOSIS — E66811 Obesity, class 1: Secondary | ICD-10-CM

## 2018-09-23 DIAGNOSIS — Z0000 Encounter for general adult medical examination without abnormal findings: Secondary | ICD-10-CM

## 2018-09-23 DIAGNOSIS — Z13220 Encounter for screening for lipoid disorders: Secondary | ICD-10-CM

## 2018-09-23 LAB — URINALYSIS, COMPLETE
Bilirubin, UA: NEGATIVE
Glucose, UA: NEGATIVE
Ketones, UA: NEGATIVE
Nitrite, UA: NEGATIVE
PH UA: 7 (ref 5.0–7.5)
Protein, UA: NEGATIVE
RBC UA: NEGATIVE
Specific Gravity, UA: 1.02 (ref 1.005–1.030)
Urobilinogen, Ur: 1 mg/dL (ref 0.2–1.0)

## 2018-09-23 LAB — MICROSCOPIC EXAMINATION
Epithelial Cells (non renal): >10 /hpf — AB (ref 0–10)
RBC, UA: NONE SEEN /hpf (ref 0–2)

## 2018-09-23 MED ORDER — OMEPRAZOLE 20 MG PO CPDR: 20.0000 mg | DELAYED_RELEASE_CAPSULE | Freq: Every day | ORAL | 3 refills | Status: AC

## 2018-09-23 MED ORDER — CHLORTHALIDONE 25 MG PO TABS: 25.0000 mg | ORAL_TABLET | Freq: Every day | ORAL | 3 refills | Status: AC

## 2018-09-23 NOTE — Progress Notes (Signed)
BP 137/85   Pulse 86   Temp (!) 97.5 F (36.4 C) (Oral)   Ht '5\' 6"'  (1.676 m)   Wt 201 lb (91.2 kg)   BMI 32.44 kg/m    Subjective:    Patient ID: Alicia Harmon, female    DOB: September 15, 1960, 58 y.o.   MRN: [id number]  HPI: Alicia Harmon is a 58 y.o. female presenting on 09/23/2018 for Hypertension (6 month follow up) and Flank Pain (Right x 2 months )   HPI Hypertension Patient is currently on chlorthalidone, and their blood pressure today is 137/85. Patient denies any lightheadedness or dizziness. Patient denies headaches, blurred vision, chest pains, shortness of breath, or weakness. Denies any side effects from medication and is content with current medication.   GERD Patient is currently on omeprazole.  She denies any major symptoms or abdominal pain or belching or burping. She denies any blood in her stool or lightheadedness or dizziness.   Patient comes in complaining of right sided flank pain/upper rib pain that is been going on for the past couple months.  She says it will come and go and it is a lot worse with bending or twisting to that side.  She says she does not have any problems when she is sitting still or relaxing or sleeping and there is no pain then but it can be moderate pain when she leans or bends to that side and she will feel sharp pain as well when it happens.  Patient denies any vaginal discharge or nausea or vomiting or dysuria or frequency.  She did leave a urine because she was concerned about the possibility of an infection.  Relevant past medical, surgical, family and social history reviewed and updated as indicated. Interim medical history since our last visit reviewed. Allergies and medications reviewed and updated.  Review of Systems  Constitutional: Negative for chills and fever.  Eyes: Negative for visual disturbance.  Respiratory: Negative for chest tightness and shortness of breath.   Cardiovascular: Negative for chest pain and leg swelling.    Gastrointestinal: Negative for abdominal distention, abdominal pain and vomiting.  Genitourinary: Positive for flank pain.  Musculoskeletal: Negative for back pain and gait problem.  Skin: Negative for rash.  Neurological: Negative for light-headedness and headaches.  Psychiatric/Behavioral: Negative for agitation and behavioral problems.  All other systems reviewed and are negative.   Per HPI unless specifically indicated above   Allergies as of 09/23/2018      Reactions   Lisinopril Hives      Medication List       Accurate as of September 23, 2018  1:21 PM. Always use your most recent med list.        chlorthalidone 25 MG tablet Commonly known as:  HYGROTON Take 1 tablet (25 mg total) by mouth daily.   omeprazole 20 MG capsule Commonly known as:  PRILOSEC Take 1 capsule (20 mg total) by mouth daily.   triamcinolone 0.025 % ointment Commonly known as:  KENALOG APPLY AS DIRECTED TWICE DAILY.   valACYclovir 1000 MG tablet Commonly known as:  VALTREX TAKE ONE TABLET BY MOUTH TWICE DAILY          Objective:    BP 137/85   Pulse 86   Temp (!) 97.5 F (36.4 C) (Oral)   Ht '5\' 6"'  (1.676 m)   Wt 201 lb (91.2 kg)   BMI 32.44 kg/m   Wt Readings from Last 3 Encounters:  09/23/18 201 lb (91.2 kg)  03/21/18  194 lb (88 kg)  02/16/18 189 lb 12.8 oz (86.1 kg)    Physical Exam Vitals signs and nursing note reviewed.  Constitutional:      General: She is not in acute distress.    Appearance: She is well-developed. She is not diaphoretic.  Eyes:     Conjunctiva/sclera: Conjunctivae normal.     Pupils: Pupils are equal, round, and reactive to light.  Cardiovascular:     Rate and Rhythm: Normal rate and regular rhythm.     Heart sounds: Normal heart sounds. No murmur.  Pulmonary:     Effort: Pulmonary effort is normal. No respiratory distress.     Breath sounds: Normal breath sounds. No wheezing.  Musculoskeletal: Normal range of motion.        General: No  tenderness (No tenderness to palpation on exam but she pinpoints the area as the right lower ribs where it is bothering her.).  Skin:    General: Skin is warm and dry.     Findings: No rash.  Neurological:     Mental Status: She is alert and oriented to person, place, and time.     Coordination: Coordination normal.  Psychiatric:        Behavior: Behavior normal.         Assessment & Plan:   Problem List Items Addressed This Visit      Cardiovascular and Mediastinum   Essential hypertension   Relevant Medications   chlorthalidone (HYGROTON) 25 MG tablet   Other Relevant Orders   CMP14+EGFR     Digestive   GERD (gastroesophageal reflux disease) - Primary   Relevant Medications   omeprazole (PRILOSEC) 20 MG capsule   Other Relevant Orders   CBC with Differential/Platelet     Other   Obesity (BMI 30.0-34.9)    Other Visit Diagnoses    Flank pain       Relevant Orders   Urinalysis, Complete   Lipid screening       Relevant Orders   Lipid panel      Plan pain is likely muscular in origin, will give muscle relaxer to help with this and then she will call back if she has any further issues.  Continue omeprazole and chlorthalidone Follow up plan: Return in about 6 months (around 03/24/2019), or if symptoms worsen or fail to improve, for Hypertension and GERD.  Counseling provided for all of the vaccine components Orders Placed This Encounter  Procedures  . Urinalysis, Complete  . CBC with Differential/Platelet  . CMP14+EGFR  . Lipid panel    Caryl Pina, MD [facility] 09/23/2018, 1:21 PM

## 2018-09-24 LAB — CMP14+EGFR
A/G RATIO: 1.1 — AB (ref 1.2–2.2)
ALBUMIN: 4.4 g/dL (ref 3.8–4.9)
ALT: 21 IU/L (ref 0–32)
AST: 34 IU/L (ref 0–40)
Alkaline Phosphatase: 114 IU/L (ref 39–117)
BUN / CREAT RATIO: 14 (ref 9–23)
BUN: 11 mg/dL (ref 6–24)
Bilirubin Total: 0.7 mg/dL (ref 0.0–1.2)
CALCIUM: 10.2 mg/dL (ref 8.7–10.2)
CO2: 24 mmol/L (ref 20–29)
CREATININE: 0.81 mg/dL (ref 0.57–1.00)
Chloride: 95 mmol/L — ABNORMAL LOW (ref 96–106)
GFR, EST AFRICAN AMERICAN: 95 mL/min/{1.73_m2} (ref >59)
GFR, EST NON AFRICAN AMERICAN: 82 mL/min/{1.73_m2} (ref >59)
GLOBULIN, TOTAL: 3.9 g/dL (ref 1.5–4.5)
Glucose: 101 mg/dL — ABNORMAL HIGH (ref 65–99)
POTASSIUM: 4.3 mmol/L (ref 3.5–5.2)
SODIUM: 137 mmol/L (ref 134–144)
TOTAL PROTEIN: 8.3 g/dL (ref 6.0–8.5)

## 2018-09-24 LAB — CBC WITH DIFFERENTIAL/PLATELET
Basophils Absolute: 0.1 10*3/uL (ref 0.0–0.2)
Basos: 1 %
EOS (ABSOLUTE): 0.3 10*3/uL (ref 0.0–0.4)
EOS: 3 %
HEMATOCRIT: 41.8 % (ref 34.0–46.6)
HEMOGLOBIN: 13.9 g/dL (ref 11.1–15.9)
IMMATURE GRANULOCYTES: 0 %
Immature Grans (Abs): 0 10*3/uL (ref 0.0–0.1)
Lymphocytes Absolute: 2.2 10*3/uL (ref 0.7–3.1)
Lymphs: 22 %
MCH: 30.7 pg (ref 26.6–33.0)
MCHC: 33.3 g/dL (ref 31.5–35.7)
MCV: 92 fL (ref 79–97)
MONOCYTES: 9 %
MONOS ABS: 0.9 10*3/uL (ref 0.1–0.9)
NEUTROS PCT: 65 %
Neutrophils Absolute: 6.6 10*3/uL (ref 1.4–7.0)
Platelets: 238 10*3/uL (ref 150–450)
RBC: 4.53 x10E6/uL (ref 3.77–5.28)
RDW: 12.2 % (ref 11.7–15.4)
WBC: 10 10*3/uL (ref 3.4–10.8)

## 2018-09-24 LAB — LIPID PANEL
Chol/HDL Ratio: 3.5 ratio (ref 0.0–4.4)
Cholesterol, Total: 177 mg/dL (ref 100–199)
HDL: 50 mg/dL (ref >39)
LDL Calculated: 106 mg/dL — ABNORMAL HIGH (ref 0–99)
Triglycerides: 105 mg/dL (ref 0–149)
VLDL Cholesterol Cal: 21 mg/dL (ref 5–40)

## 2018-09-26 ENCOUNTER — Telehealth: Payer: Self-pay | Admitting: Family Medicine

## 2018-09-26 MED ORDER — CYCLOBENZAPRINE HCL 10 MG PO TABS: 10.0000 mg | ORAL_TABLET | Freq: Three times a day (TID) | ORAL | 3 refills | Status: AC | PRN

## 2018-09-26 NOTE — Telephone Encounter (Signed)
I sent it in for her just now

## 2018-09-26 NOTE — Telephone Encounter (Signed)
Patient aware.

## 2018-09-27 ENCOUNTER — Other Ambulatory Visit: Payer: Self-pay | Admitting: Family Medicine

## 2018-09-27 DIAGNOSIS — B0089 Other herpesviral infection: Secondary | ICD-10-CM

## 2018-10-24 ENCOUNTER — Ambulatory Visit (INDEPENDENT_AMBULATORY_CARE_PROVIDER_SITE_OTHER): Payer: Medicaid Other | Admitting: Internal Medicine

## 2018-10-24 ENCOUNTER — Encounter (INDEPENDENT_AMBULATORY_CARE_PROVIDER_SITE_OTHER): Payer: Self-pay | Admitting: Internal Medicine

## 2018-10-24 VITALS — BP 158/87 | HR 100 | Temp 98.7°F | Ht 64.0 in | Wt 200.1 lb

## 2018-10-24 DIAGNOSIS — R160 Hepatomegaly, not elsewhere classified: Secondary | ICD-10-CM

## 2018-10-24 DIAGNOSIS — B182 Chronic viral hepatitis C: Secondary | ICD-10-CM

## 2018-10-24 DIAGNOSIS — K769 Liver disease, unspecified: Secondary | ICD-10-CM

## 2018-10-24 NOTE — Progress Notes (Signed)
   Subjective:    Patient ID: Alicia Harmon, female    DOB: 1961-06-20, 58 y.o.   MRN: [id number]  HPI Referred by Dr. Warrick Parisian for liver mass/RUQ pain. She underwent a US abdomen complete 10/11/2018 for RUQ pain which revealed large heterogeneous predominantly rt hepatic lobe mass measuring 13 x 7 x 10cm. Given to patient;'s underlying cirrhosis this is most likely a large hepatoma. Recommend MRI abdomen w/wo CM for further evaluation.  Status post cholecystectomy. No biliary dilatation. Splenomegaly. Unremarkable sonographic appearance of the pancreas and both kidneys. States she has a known hx of cirrhosis. States she has had the liver mass for along time.  Hurts to turn from side to side. Also hurts to walk at time. Her appetite is okay. No weight loss.   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 Has been treated for Hepatitis C. Treated 3 yrs ago.  Treated with Harvoni by Dr. Britta Mccreedy. Review of Systems Past Medical History:  Diagnosis Date  . Allergy   . Anxiety   . Arthritis   . Blood transfusion without reported diagnosis   . 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 Depression   . GERD (gastroesophageal reflux disease)   . Hepatitis C   . Substance abuse ([place])    past cocaine user; smokes marijuana now    Past Surgical History:  Procedure Laterality Date  . CHOLECYSTECTOMY      Allergies  Allergen Reactions  . Lisinopril Hives    Current Outpatient Medications on File Prior to Visit  Medication Sig Dispense Refill  . chlorthalidone (HYGROTON) 25 MG tablet Take 1 tablet (25 mg total) by mouth daily. 90 tablet 3  . cyclobenzaprine (FLEXERIL) 10 MG tablet Take 1 tablet (10 mg total) by mouth 3 (three) times daily as needed for muscle spasms. 60 tablet 3  . omeprazole (PRILOSEC) 20 MG capsule Take 1 capsule (20 mg total) by mouth daily. 90 capsule 3  . valACYclovir (VALTREX) 1000 MG tablet TAKE ONE TABLET BY MOUTH TWICE DAILY 60 tablet 2   No current facility-administered medications  on file prior to visit.         Objective:   Physical Exam Blood pressure (!) 158/87, pulse 100, temperature 98.7 F (37.1 C), height 5\' 4"  (1.626 m), weight 200 lb 1.6 oz (90.8 kg). Alert and oriented. Skin warm and dry. Oral mucosa is moist.   . Sclera anicteric, conjunctivae is pink. Thyroid not enlarged. No cervical lymphadenopathy. Lungs clear. Heart regular rate and rhythm.  Abdomen is soft. Bowel sounds are positive. Liver is enlarged.  No abdominal masses felt. No tenderness.  No edema to lower extremities.          Assessment & Plan:  Liver Mass. Needs an MRI abdomen w/wo CM. Hepatic, CBC, AFP, Hep C quaint.  Further recommendations to follow. Will get records from [facility].

## 2018-10-24 NOTE — Patient Instructions (Signed)
Labs and US abdomen.  

## 2018-10-26 ENCOUNTER — Ambulatory Visit (INDEPENDENT_AMBULATORY_CARE_PROVIDER_SITE_OTHER): Payer: Self-pay | Admitting: Internal Medicine

## 2018-10-26 LAB — HEPATIC FUNCTION PANEL
AG Ratio: 0.9 (calc) — ABNORMAL LOW (ref 1.0–2.5)
ALT: 22 U/L (ref 6–29)
AST: 35 U/L (ref 10–35)
Albumin: 3.8 g/dL (ref 3.6–5.1)
Alkaline phosphatase (APISO): 100 U/L (ref 37–153)
Bilirubin, Direct: 0.2 mg/dL (ref 0.0–0.2)
Globulin: 4.1 g/dL (calc) — ABNORMAL HIGH (ref 1.9–3.7)
Indirect Bilirubin: 0.7 mg/dL (calc) (ref 0.2–1.2)
Total Bilirubin: 0.9 mg/dL (ref 0.2–1.2)
Total Protein: 7.9 g/dL (ref 6.1–8.1)

## 2018-10-26 LAB — CBC WITH DIFFERENTIAL/PLATELET
Absolute Monocytes: 830 cells/uL (ref 200–950)
Basophils Absolute: 47 cells/uL (ref 0–200)
Basophils Relative: 0.6 %
Eosinophils Absolute: 150 cells/uL (ref 15–500)
Eosinophils Relative: 1.9 %
HCT: 35.8 % (ref 35.0–45.0)
Hemoglobin: 12.1 g/dL (ref 11.7–15.5)
Lymphs Abs: 1106 cells/uL (ref 850–3900)
MCH: 30 pg (ref 27.0–33.0)
MCHC: 33.8 g/dL (ref 32.0–36.0)
MCV: 88.8 fL (ref 80.0–100.0)
MPV: 11.1 fL (ref 7.5–12.5)
Monocytes Relative: 10.5 %
Neutro Abs: 5767 cells/uL (ref 1500–7800)
Neutrophils Relative %: 73 %
PLATELETS: 165 10*3/uL (ref 140–400)
RBC: 4.03 10*6/uL (ref 3.80–5.10)
RDW: 12.5 % (ref 11.0–15.0)
Total Lymphocyte: 14 %
WBC: 7.9 10*3/uL (ref 3.8–10.8)

## 2018-10-26 LAB — HEPATITIS C RNA QUANTITATIVE
HCV Quantitative Log: <1.18 Log IU/mL
HCV RNA, PCR, QN: NOT DETECTED [IU]/mL

## 2018-10-26 LAB — AFP TUMOR MARKER: AFP-Tumor Marker: 2.5 ng/mL

## 2018-11-01 ENCOUNTER — Other Ambulatory Visit: Payer: Self-pay

## 2018-11-01 ENCOUNTER — Ambulatory Visit (HOSPITAL_COMMUNITY)
Admission: RE | Admit: 2018-11-01 | Discharge: 2018-11-01 | Disposition: A | Discharge status: Home / Self Care | Payer: Medicaid Other | Source: Ambulatory Visit | Attending: Internal Medicine | Admitting: Internal Medicine

## 2018-11-01 DIAGNOSIS — K769 Liver disease, unspecified: Secondary | ICD-10-CM | POA: Diagnosis present

## 2018-11-01 MED ORDER — GADOBUTROL 1 MMOL/ML IV SOLN
10.0000 mL | Freq: Once | INTRAVENOUS | Status: AC | PRN
  Administered 2018-11-01: 10 mL via INTRAVENOUS

## 2018-11-09 ENCOUNTER — Encounter (INDEPENDENT_AMBULATORY_CARE_PROVIDER_SITE_OTHER): Payer: Self-pay | Admitting: Internal Medicine

## 2018-11-09 ENCOUNTER — Ambulatory Visit (INDEPENDENT_AMBULATORY_CARE_PROVIDER_SITE_OTHER): Payer: Medicaid Other | Admitting: Internal Medicine

## 2018-11-09 ENCOUNTER — Other Ambulatory Visit: Payer: Self-pay

## 2018-11-09 VITALS — BP 155/99 | HR 128 | Temp 97.8°F | Ht 64.0 in | Wt 202.0 lb

## 2018-11-09 DIAGNOSIS — C220 Liver cell carcinoma: Secondary | ICD-10-CM

## 2018-11-09 MED ORDER — HYDROCODONE-ACETAMINOPHEN 5-325 MG PO TABS: 1.0000 | ORAL_TABLET | Freq: Four times a day (QID) | ORAL | 0 refills | Status: DC | PRN

## 2018-11-09 NOTE — Patient Instructions (Signed)
Referral to the [place] [facility].

## 2018-11-09 NOTE — Progress Notes (Signed)
   Subjective:    Patient ID: Alicia Harmon, female    DOB: [birth year], 58 y.o.   MRN: [id number]  HPI Here tpday to discuss recent MRI results. Patient seen in office 10/24/2018 for liver mass.    She underwent a US abdomen complete 10/11/2018 for RUQ pain which revealed large heterogeneous predominantly rt hepatic lobe mass measuring 13 x 7 x 10cm. Given to patient;'s underlying cirrhosis this is most likely a large hepatoma. Recommend MRI abdomen w/wo CM for further evaluation.  Status post cholecystectomy. No biliary dilatation. Splenomegaly. Unremarkable sonographic appearance of the pancreas and both kidneys.  MRI ordered and revealed: IMPRESSION: 1. Hepatic cirrhosis with large mass centered in the right lobe of the liver compatible with hepatocellular carcinoma (LI-RADS 5) with numerous satellite nodules throughout the liver and tumor thrombus extending from the right hepatic vein into the inferior vena cava. Some borderline enlarged and mildly enlarged upper abdominal lymph nodes are also noted. 2. Splenomegaly.  Hx of Hepatitis C and was treated 3 yrs ago with Harvoni.  10/24/2018 Remains in remission with her Hepatitis C.  10/24/2018 2.5  Hepatic Function Latest Ref Rng & Units 10/24/2018 09/23/2018 02/16/2018  Total Protein 6.1 - 8.1 g/dL 7.9 8.3 8.2  Albumin 3.8 - 4.9 g/dL - 4.4 4.8  AST 10 - 35 U/L 35 34 31  ALT 6 - 29 U/L '22 21 28  '$ Alk Phosphatase 39 - 117 IU/L - 114 61  Total Bilirubin 0.2 - 1.2 mg/dL 0.9 0.7 0.7  Bilirubin, Direct 0.0 - 0.2 mg/dL 0.2 - -   CBC    Component Value Date/Time   WBC 7.9 10/24/2018 1505   RBC 4.03 10/24/2018 1505   HGB 12.1 10/24/2018 1505   HGB 13.9 09/23/2018 1405   HCT 35.8 10/24/2018 1505   HCT 41.8 09/23/2018 1405   PLT 165 10/24/2018 1505   PLT 238 09/23/2018 1405   MCV 88.8 10/24/2018 1505   MCV 92 09/23/2018 1405   MCH 30.0 10/24/2018 1505   MCHC 33.8 10/24/2018 1505   RDW 12.5 10/24/2018 1505   RDW 12.2 09/23/2018 1405   LYMPHSABS  1,106 10/24/2018 1505   LYMPHSABS 2.2 09/23/2018 1405   EOSABS 150 10/24/2018 1505   EOSABS 0.3 09/23/2018 1405   BASOSABS 47 10/24/2018 1505   BASOSABS 0.1 09/23/2018 1405         Review of Systems     Objective:   Physical Exam Blood pressure (!) 155/99, pulse (!) 128, temperature 97.8 F (36.6 C), height '5\' 4"'$  (1.626 m), weight 202 lb (91.6 kg). Alert and oriented. Skin warm and dry. Oral mucosa is moist.   . Sclera anicteric, conjunctivae is pink. Thyroid not enlarged. No cervical lymphadenopathy. Bilateral wheezes Heart regular rate and rhythm.  Abdomen is soft. Bowel sounds are positive. hepatomegaly. No abdominal masses felt. No tenderness.  No edema to lower extremities.           Assessment & Plan:  Liver Cancer. I discussed this case with Dr. Laural Golden.  Referral to [facility].   Dr. Delton Coombes.

## 2018-11-15 ENCOUNTER — Inpatient Hospital Stay (HOSPITAL_COMMUNITY): Payer: Medicaid Other

## 2018-11-15 ENCOUNTER — Inpatient Hospital Stay (HOSPITAL_COMMUNITY): Payer: Medicaid Other | Attending: Hematology | Admitting: Hematology

## 2018-11-15 ENCOUNTER — Encounter (HOSPITAL_COMMUNITY): Payer: Self-pay | Admitting: Hematology

## 2018-11-15 ENCOUNTER — Other Ambulatory Visit: Payer: Self-pay

## 2018-11-15 VITALS — BP 141/92 | HR 110 | Temp 97.2°F | Wt 204.2 lb

## 2018-11-15 DIAGNOSIS — F1011 Alcohol abuse, in remission: Secondary | ICD-10-CM

## 2018-11-15 DIAGNOSIS — K219 Gastro-esophageal reflux disease without esophagitis: Secondary | ICD-10-CM | POA: Diagnosis not present

## 2018-11-15 DIAGNOSIS — F329 Major depressive disorder, single episode, unspecified: Secondary | ICD-10-CM | POA: Diagnosis not present

## 2018-11-15 DIAGNOSIS — Z87891 Personal history of nicotine dependence: Secondary | ICD-10-CM | POA: Diagnosis not present

## 2018-11-15 DIAGNOSIS — R5383 Other fatigue: Secondary | ICD-10-CM | POA: Diagnosis not present

## 2018-11-15 DIAGNOSIS — C220 Liver cell carcinoma: Secondary | ICD-10-CM | POA: Insufficient documentation

## 2018-11-15 DIAGNOSIS — R1011 Right upper quadrant pain: Secondary | ICD-10-CM | POA: Diagnosis not present

## 2018-11-15 DIAGNOSIS — R161 Splenomegaly, not elsewhere classified: Secondary | ICD-10-CM | POA: Insufficient documentation

## 2018-11-15 DIAGNOSIS — Z800 Family history of malignant neoplasm of digestive organs: Secondary | ICD-10-CM | POA: Insufficient documentation

## 2018-11-15 DIAGNOSIS — B1920 Unspecified viral hepatitis C without hepatic coma: Secondary | ICD-10-CM | POA: Diagnosis not present

## 2018-11-15 DIAGNOSIS — Z8042 Family history of malignant neoplasm of prostate: Secondary | ICD-10-CM

## 2018-11-15 DIAGNOSIS — R630 Anorexia: Secondary | ICD-10-CM | POA: Diagnosis not present

## 2018-11-15 DIAGNOSIS — Z79899 Other long term (current) drug therapy: Secondary | ICD-10-CM

## 2018-11-15 LAB — PROTIME-INR
INR: 1.3 — ABNORMAL HIGH (ref 0.8–1.2)
Prothrombin Time: 15.6 seconds — ABNORMAL HIGH (ref 11.4–15.2)

## 2018-11-15 MED ORDER — HYDROCODONE-ACETAMINOPHEN 5-325 MG PO TABS: 1.0000 | ORAL_TABLET | Freq: Three times a day (TID) | ORAL | 0 refills | Status: DC | PRN

## 2018-11-15 NOTE — Patient Instructions (Signed)
[place] at [facility] Discharge Instructions  You were seen today by Dr. Delton Coombes. He went over your family history and how you've been feeling lately. He will schedule you for CT scans and a bone scan. He would like to get labs today as well.  He will see you back after your scans to follow up.   Thank you for choosing [place] at [facility] to provide your oncology and hematology care.  To afford each patient quality time with our provider, please arrive at least 15 minutes before your scheduled appointment time.   If you have a lab appointment with the [facility] please come in thru the  Main Entrance and check in at the main information desk  You need to re-schedule your appointment should you arrive 10 or more minutes late.  We strive to give you quality time with our providers, and arriving late affects you and other patients whose appointments are after yours.  Also, if you no show three or more times for appointments you may be dismissed from the clinic at the providers discretion.     Again, thank you for choosing [facility].  Our hope is that these requests will decrease the amount of time that you wait before being seen by our physicians.       _____________________________________________________________  Should you have questions after your visit to [facility], please contact our office at [phone] between the hours of 8:00 a.m. and 4:30 p.m.  Voicemails left after 4:00 p.m. will not be returned until the following business day.  For prescription refill requests, have your pharmacy contact our office and allow 72 hours.    Cancer Center Support Programs:   > Cancer Support Group  2nd Tuesday of the month 1pm-2pm, Journey Room

## 2018-11-15 NOTE — Progress Notes (Signed)
[facility] NOTE  Patient Care Team: Dettinger, Fransisca Kaufmann, MD as PCP - General (Family Medicine)  CHIEF COMPLAINTS/PURPOSE OF CONSULTATION:  Hepatocellular cancer  HISTORY OF PRESENTING ILLNESS:  Alicia Harmon 58 y.o. female is here because of hepatocellular cancer. She states that this all started out with a pain in the right side. She states that the pain medication that she was given helped. She was a heavy drinker when she was younger. She states that she had Hepatitis C due to 10 years of needle use. She states that she is having abdominal bloating as well. She states that she is constipated. She also states that she has a good appetite. She lives at home alone, her daughter lives near her. Denies any nausea, vomiting, or diarrhea. Denies any new pains. Had not noticed any recent bleeding such as epistaxis, hematuria or hematochezia. Denies recent chest pain on exertion, shortness of breath on minimal exertion, pre-syncopal episodes, or palpitations. Denies any numbness or tingling in hands or feet. Denies any recent fevers, infections, or recent hospitalizations. Patient reports appetite at 75% and energy level at 50%. Family history was significant with maternal grandmother with stomach cancer and 2 maternal aunts with stomach cancer and maternal uncle with prostate cancer.   MEDICAL HISTORY:  Past Medical History:  Diagnosis Date  . Allergy   . Anxiety   . Arthritis   . Blood transfusion without reported diagnosis   . 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 Depression   . GERD (gastroesophageal reflux disease)   . Hepatitis C   . Substance abuse ([place])    past cocaine user; smokes marijuana now    SURGICAL HISTORY: Past Surgical History:  Procedure Laterality Date  . CHOLECYSTECTOMY      SOCIAL HISTORY: Social History   Socioeconomic History  . Marital status: Single    Spouse name: Not on file  . Number of children: 5  . Years of education:  Not on file  . Highest education level: Not on file  Occupational History  . Occupation: SSI  Social Needs  . Financial resource strain: Very hard  . Food insecurity:    Worry: Sometimes true    Inability: Sometimes true  . Transportation needs:    Medical: No    Non-medical: No  Tobacco Use  . Smoking status: Former Smoker    Packs/day: 0.25    Years: 40.00    Pack years: 10.00  . Smokeless tobacco: Never Used  Substance and Sexual Activity  . Alcohol use: Yes    Alcohol/week: 3.0 standard drinks    Types: 3 Cans of beer per week    Comment: 6 pack on the weekend sometimes. No etoh during the weeks.   . Drug use: Yes    Types: Marijuana    Comment: 1 every other day  . Sexual activity: Never    Birth control/protection: Condom  Lifestyle  . Physical activity:    Days per week: Not on file    Minutes per session: Not on file  . Stress: Not on file  Relationships  . Social connections:    Talks on phone: Not on file    Gets together: Not on file    Attends religious service: Not on file    Active member of club or organization: Not on file    Attends meetings of clubs or organizations: Not on file    Relationship status: Not on file  . Intimate partner violence:  Fear of current or ex partner: Not on file    Emotionally abused: Not on file    Physically abused: Not on file    Forced sexual activity: Not on file  Other Topics Concern  . Not on file  Social History Narrative  . Not on file    FAMILY HISTORY: Family History  Problem Relation Age of Onset  . Diabetes Mother   . Heart disease Mother   . Mental illness Sister   . Mental illness Brother   . Mental illness Brother   . Alcohol abuse Brother   . Alcohol abuse Brother     ALLERGIES:  is allergic to lisinopril.  MEDICATIONS:  Current Outpatient Medications  Medication Sig Dispense Refill  . chlorthalidone (HYGROTON) 25 MG tablet Take 1 tablet (25 mg total) by mouth daily. 90 tablet 3  .  cyclobenzaprine (FLEXERIL) 10 MG tablet Take 1 tablet (10 mg total) by mouth 3 (three) times daily as needed for muscle spasms. 60 tablet 3  . cyclobenzaprine (FLEXERIL) 10 MG tablet TAKE ONE TABLET BY MOUTH THREE TIMES DAILY AS NEEDED FOR MUSCLE SPASMS.    Marland Kitchen HYDROcodone-acetaminophen (NORCO/VICODIN) 5-325 MG tablet Take 1 tablet by mouth every 8 (eight) hours as needed for moderate pain. 42 tablet 0  . omeprazole (PRILOSEC) 20 MG capsule Take 1 capsule (20 mg total) by mouth daily. 90 capsule 3  . omeprazole (PRILOSEC) 20 MG capsule Take by mouth.    . triamcinolone (KENALOG) 0.025 % ointment APPLY AS DIRECTED TWICE DAILY.    Marland Kitchen triamcinolone (KENALOG) 0.025 % ointment APPLY AS DIRECTED TWICE DAILY.    . valACYclovir (VALTREX) 1000 MG tablet TAKE ONE TABLET BY MOUTH TWICE DAILY 60 tablet 2   No current facility-administered medications for this visit.     REVIEW OF SYSTEMS:   Constitutional: Denies fevers, chills or abnormal night sweats Eyes: Denies blurriness of vision, double vision or watery eyes Ears, nose, mouth, throat, and face: Denies mucositis or sore throat Respiratory: Denies cough, dyspnea or wheezes Cardiovascular: Denies palpitation, chest discomfort or lower extremity swelling Gastrointestinal:  Denies nausea, heartburn or change in bowel habits Skin: Denies abnormal skin rashes Lymphatics: Denies new lymphadenopathy or easy bruising Neurological:Denies numbness, tingling or new weaknesses Behavioral/Psych: Mood is stable, no new changes  All other systems were reviewed with the patient and are negative.  PHYSICAL EXAMINATION: ECOG PERFORMANCE STATUS: 1 - Symptomatic but completely ambulatory  Vitals:   11/15/18 1346  BP: (!) 141/92  Pulse: (!) 110  Temp: (!) 97.2 F (36.2 C)  SpO2: 99%   Filed Weights   11/15/18 1346  Weight: 204 lb 3.2 oz (92.6 kg)    GENERAL:alert, no distress and comfortable SKIN: skin color, texture, turgor are normal, no rashes or  significant lesions EYES: normal, conjunctiva are pink and non-injected, sclera clear OROPHARYNX:no exudate, no erythema and lips, buccal mucosa, and tongue normal  NECK: supple, thyroid normal size, non-tender, without nodularity LYMPH:  no palpable lymphadenopathy in the cervical, axillary or inguinal LUNGS: clear to auscultation and percussion with normal breathing effort HEART: regular rate & rhythm and no murmurs and no lower extremity edema ABDOMEN:abdomen soft, and normal bowel sounds.  Slight tenderness in the right upper quadrant. Musculoskeletal:no cyanosis of digits and no clubbing  PSYCH: alert & oriented x 3 with fluent speech NEURO: no focal motor/sensory deficits  LABORATORY DATA:  I have reviewed the data as listed Lab Results  Component Value Date   WBC 7.9 10/24/2018  HGB 12.1 10/24/2018   HCT 35.8 10/24/2018   MCV 88.8 10/24/2018   PLT 165 10/24/2018     Chemistry      Component Value Date/Time   NA 137 09/23/2018 1405   K 4.3 09/23/2018 1405   CL 95 (L) 09/23/2018 1405   CO2 24 09/23/2018 1405   BUN 11 09/23/2018 1405   CREATININE 0.81 09/23/2018 1405      Component Value Date/Time   CALCIUM 10.2 09/23/2018 1405   ALKPHOS 114 09/23/2018 1405   AST 35 10/24/2018 1505   ALT 22 10/24/2018 1505   BILITOT 0.9 10/24/2018 1505   BILITOT 0.7 09/23/2018 1405       RADIOGRAPHIC STUDIES: I have personally reviewed the radiological images as listed and agreed with the findings in the report. Mr Abdomen W Wo Contrast  Result Date: 11/01/2018 CLINICAL DATA:  58 year old female with history of abnormal ultrasound. Upper abdominal pain and swelling. EXAM: MRI ABDOMEN WITHOUT AND WITH CONTRAST TECHNIQUE: Multiplanar multisequence MR imaging of the abdomen was performed both before and after the administration of intravenous contrast. CONTRAST:  10 mL of Gadavist. COMPARISON:  No priors. FINDINGS: Lower chest: Unremarkable. Hepatobiliary: Liver has a shrunken  appearance and nodular contour, indicative of underlying cirrhosis. In the right lobe of the liver there is a large heterogeneously hypervascular mass which is estimated to measure approximately 7.6 x 7.8 x 15.6 cm (axial image 46 of series 21 and coronal image 51 of series 19). This lesion is predominantly T1 isointense to slightly T1 hypointense, heterogeneously T2 hyperintense, demonstrates internal diffusion restriction, and demonstrates heterogeneous enhancement on arterial phase imaging with apparent capsule, and washout on delayed imaging with pseudocapsule. Several internal areas are somewhat hypovascular, which may reflect internal areas of necrosis. In addition, in the surrounding hepatic parenchyma there are innumerable other smaller hypervascular lesions which demonstrate washout on delayed imaging, most compatible with satellite foci of [place]. Importantly, the main tumor extends into the right hepatic vein to the level of the inferior vena cava, and a portion of this thrombus demonstrates enhancement, compatible with tumor thrombus (best appreciated on axial images 26-28 of series 17). The right branch of the portal vein is poorly demonstrated, and may also be thrombosed. No intra or extrahepatic biliary ductal dilatation. Status post cholecystectomy. Pancreas: No pancreatic mass. No pancreatic ductal dilatation. No pancreatic or peripancreatic fluid or inflammatory changes. Spleen: Spleen is mildly enlarged measuring 14.3 x 6.4 x 14.0 cm (estimated splenic volume of 641 mL) . Adrenals/Urinary Tract: Bilateral kidneys and adrenal glands are normal in appearance. No hydroureteronephrosis in the visualized portions of the abdomen. Stomach/Bowel: Unremarkable. Vascular/Lymphatic: No aneurysm identified in the visualized abdominal vasculature. Multiple prominent borderline enlarged and mildly enlarged upper abdominal lymph nodes are noted, largest of which is in the hepatoduodenal ligament measuring 11 mm in  short axis (axial image 45 of series 13). Other: No significant volume of ascites noted in the visualized portions of the peritoneal cavity. Musculoskeletal: No aggressive appearing osseous lesions are noted in the visualized portions of the skeleton. IMPRESSION: 1. Hepatic cirrhosis with large mass centered in the right lobe of the liver compatible with hepatocellular carcinoma (LI-RADS 5) with numerous satellite nodules throughout the liver and tumor thrombus extending from the right hepatic vein into the inferior vena cava. Some borderline enlarged and mildly enlarged upper abdominal lymph nodes are also noted. 2. Splenomegaly. Electronically Signed   By: Vinnie Langton M.D.   On: 11/01/2018 15:16    ASSESSMENT & PLAN:  Hepatocellular carcinoma ([place]) 1.  Hepatocellular carcinoma: - Presentation with 44-month history of right upper quadrant pain. 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 - An ultrasound of the abdomen on 10/11/2018 done at [facility] shows large mass in the right hepatic lobe measuring 13 x 7 x 10 cm. - Evaluated by Dr.Rehman with MRI on 11/01/2018 showing large hypervascular mass in the right lobe of the liver measuring 7.6 x 7.8 x 15.6 cm.  There are innumerable other smaller hypervascular lesions most compatible with satellite foci of [place].  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  No intra-/extrahepatic biliary ductal dilation.  Spleen is mildly enlarged at 14.3 cm.  Multiple prominent borderline enlarged and mildly enlarged upper abdominal lymph nodes noted, largest in the hepatoduodenal ligament measuring 11 mm in short axis. - AFP was 2.5. - Patient reports some bloating sensation.  Denies any  nausea vomiting.  She has constipation all her life. - I have recommended staging with CT CAP with contrast and a whole-body bone scan. - We will also check her PT for calculation of child's class.  2.  Right upper quadrant pain: - She reports constant sharp pain in the right upper quadrant sometimes radiating to the right breast. - Pain is relieved on lying on the left side.  Pain is worse on walking. - She has taken hydrocodone 5 mg every 6 hours which helped with the pain. - I will give her a refill today.  3.  Family history: -Maternal grandmother and 2 maternal aunts had stomach cancer. -One maternal uncle had prostate cancer.  Orders Placed This Encounter  Procedures  . CT Abdomen Pelvis W Contrast    Standing Status:   Future    Standing Expiration Date:   11/15/2019    Order Specific Question:   ** REASON FOR EXAM (FREE TEXT)    Answer:   hepatocellular cancer    Order Specific Question:   If indicated for the ordered procedure, I authorize the administration of contrast media per Radiology protocol    Answer:   Yes    Order Specific Question:   Is patient pregnant?    Answer:   No    Order Specific Question:   Preferred imaging location?    Answer:   [facility]    Order Specific Question:   Is Oral Contrast requested for this exam?    Answer:   Yes, Per Radiology protocol    Order Specific Question:   Radiology Contrast Protocol - do NOT remove file path    Answer:   \\[email or web address]\Radiant\CTProtocols.pdf  . CT Chest W Contrast    Standing Status:   Future    Standing Expiration Date:   11/15/2019    Order Specific Question:   ** REASON FOR EXAM (FREE TEXT)    Answer:   hepatocellular cancer    Order Specific Question:   If indicated for the ordered procedure, I authorize the administration of contrast media per Radiology protocol    Answer:   Yes    Order Specific Question:   Is patient pregnant?    Answer:   No    Order Specific Question:   Preferred  imaging location?  Answer:   [facility]    Order Specific Question:   Radiology Contrast Protocol - do NOT remove file path    Answer:   \\[email or web address]\Radiant\CTProtocols.pdf  . NM Bone Scan Whole Body    Standing Status:   Future    Standing Expiration Date:   11/15/2019    Order Specific Question:   ** REASON FOR EXAM (FREE TEXT)    Answer:   hepatocellular cancer    Order Specific Question:   If indicated for the ordered procedure, I authorize the administration of a radiopharmaceutical per Radiology protocol    Answer:   Yes    Order Specific Question:   Is the patient pregnant?    Answer:   No    Order Specific Question:   Preferred imaging location?    Answer:   [facility]    Order Specific Question:   Radiology Contrast Protocol - do NOT remove file path    Answer:   \\[email or web address]\Radiant\[email or web address].pdf  . Hepatitis B surface antigen    Standing Status:   Future    Number of Occurrences:   1    Standing Expiration Date:   11/15/2019  . Hepatitis B surface antibody    Standing Status:   Future    Number of Occurrences:   1    Standing Expiration Date:   11/15/2019  . Protime-INR    Standing Status:   Future    Number of Occurrences:   1    Standing Expiration Date:   11/15/2019    All questions were answered. The patient knows to call the clinic with any problems, questions or concerns.     Derek Jack, MD 11/15/2018 4:24 PM

## 2018-11-15 NOTE — Assessment & Plan Note (Addendum)
1.  Hepatocellular carcinoma: - Presentation with 53-month history of right upper quadrant pain. 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 - An ultrasound of the abdomen on 10/11/2018 done at [facility] shows large mass in the right hepatic lobe measuring 13 x 7 x 10 cm. - Evaluated by Dr.Rehman with MRI on 11/01/2018 showing large hypervascular mass in the right lobe of the liver measuring 7.6 x 7.8 x 15.6 cm.  There are innumerable other smaller hypervascular lesions most compatible with satellite foci of [place].  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  No intra-/extrahepatic biliary ductal dilation.  Spleen is mildly enlarged at 14.3 cm.  Multiple prominent borderline enlarged and mildly enlarged upper abdominal lymph nodes noted, largest in the hepatoduodenal ligament measuring 11 mm in short axis. - AFP was 2.5. - Patient reports some bloating sensation.  Denies any nausea vomiting.  She has constipation all her life. - I have recommended staging with CT CAP with contrast and a whole-body bone scan. - We will also check her PT for calculation of child's class.  2.  Right upper quadrant pain: - She reports constant sharp pain in the right upper quadrant sometimes radiating to the right breast. - Pain is relieved on lying on the left side.  Pain is worse on walking. - She has taken hydrocodone 5 mg every 6 hours which helped with the pain. - I will give her a refill today.  3.  Family history: -Maternal grandmother and 2 maternal aunts had stomach cancer. -One maternal uncle had prostate cancer.

## 2018-11-16 ENCOUNTER — Encounter: Payer: Self-pay | Admitting: General Practice

## 2018-11-16 LAB — HEPATITIS B SURFACE ANTIBODY,QUALITATIVE: Hep B S Ab: NONREACTIVE

## 2018-11-16 LAB — HEPATITIS B SURFACE ANTIGEN: Hepatitis B Surface Ag: NEGATIVE

## 2018-11-16 NOTE — Progress Notes (Signed)
[place] Psychosocial Distress Screening Clinical Social Work  Clinical Social Work was referred by distress screening protocol.  The patient scored a 9 on the Psychosocial Distress Thermometer which indicates severe distress. Clinical Social Worker contacted patient by phone to assess for distress and other psychosocial needs. Lives alone but daughter lives several trailers away.  Brother helps her get to store and similar, charges her gas money for this transport.  Patient has full Medicaid, Food Stamps and disability income.  No concerns at this time about being able to make ends meet.  Aware of option to use [place] to get to/from appointments - has used system in the past and knows how to arrange these rides.  May benefit from additional food assistance as she has difficulty getting to store and finding appropriate food resources at this time.  ONCBCN DISTRESS SCREENING 11/15/2018  Screening Type Initial Screening  Distress experienced in past week (1-10) 9  Practical problem type Transportation;Insurance;Housing  Emotional problem type Nervousness/Anxiety;Depression;Adjusting to illness;Isolation/feeling alone  Spiritual/Religous concerns type Relating to God  Information Concerns Type Lack of info about diagnosis;Lack of info about treatment;Lack of info about complementary therapy choices  Physical Problem type Pain;Sleep/insomnia  Physician notified of physical symptoms Yes  Referral to clinical psychology No  Referral to clinical social work No  Referral to dietition No  Referral to financial advocate No  Referral to support programs Yes    Clinical Social Worker follow up needed: No.  If yes, follow up plan:  Beverely Pace, [place], LCSW Clinical Social Worker Phone:  [phone]

## 2018-11-16 NOTE — Progress Notes (Signed)
[place] Team contacted patient to assess for food insecurity and other psychosocial needs during current COVID19 pandemic.    Patient/family expressed concerns regarding access to food or managing costs of food during this time.  Support Team member discussed resources and will follow up as resources are identified.  Support Team member encouraged patient to call if changes occur or they have any other questions/concerns.   Beverely Pace, [place]

## 2018-11-17 ENCOUNTER — Encounter (HOSPITAL_COMMUNITY)
Admission: RE | Admit: 2018-11-17 | Discharge: 2018-11-17 | Disposition: A | Discharge status: Home / Self Care | Payer: Medicaid Other | Source: Ambulatory Visit | Attending: Hematology | Admitting: Hematology

## 2018-11-17 ENCOUNTER — Ambulatory Visit (HOSPITAL_COMMUNITY)
Admission: RE | Admit: 2018-11-17 | Discharge: 2018-11-17 | Disposition: A | Discharge status: Home / Self Care | Payer: Medicaid Other | Source: Ambulatory Visit | Attending: Hematology | Admitting: Hematology

## 2018-11-17 ENCOUNTER — Encounter (HOSPITAL_COMMUNITY): Payer: Self-pay

## 2018-11-17 ENCOUNTER — Encounter: Payer: Self-pay | Admitting: General Practice

## 2018-11-17 ENCOUNTER — Other Ambulatory Visit: Payer: Self-pay

## 2018-11-17 DIAGNOSIS — C220 Liver cell carcinoma: Secondary | ICD-10-CM | POA: Insufficient documentation

## 2018-11-17 HISTORY — DX: Essential (primary) hypertension: I10

## 2018-11-17 LAB — POCT I-STAT CREATININE: Creatinine, Ser: 0.6 mg/dL (ref 0.44–1.00)

## 2018-11-17 MED ORDER — TECHNETIUM TC 99M MEDRONATE IV KIT
20.0000 | PACK | Freq: Once | INTRAVENOUS | Status: AC | PRN
  Administered 2018-11-17: 20 via INTRAVENOUS

## 2018-11-17 MED ORDER — IOHEXOL 300 MG/ML  SOLN
100.0000 mL | Freq: Once | INTRAMUSCULAR | Status: AC | PRN
  Administered 2018-11-17: 12:00:00 100 mL via INTRAVENOUS

## 2018-11-17 NOTE — Progress Notes (Signed)
[place] CSW Progress Notes  COVID 19 information support and financial resources packet mailed.    Edwyna Shell, LCSW Clinical Social Worker Phone:  [phone]

## 2018-11-18 ENCOUNTER — Inpatient Hospital Stay (HOSPITAL_COMMUNITY): Payer: Medicaid Other | Attending: Hematology | Admitting: Hematology

## 2018-11-18 ENCOUNTER — Inpatient Hospital Stay (HOSPITAL_COMMUNITY): Payer: Medicaid Other

## 2018-11-18 ENCOUNTER — Encounter (HOSPITAL_COMMUNITY)
Admission: RE | Admit: 2018-11-18 | Discharge: 2018-11-18 | Disposition: A | Discharge status: Home / Self Care | Payer: Medicaid Other | Source: Ambulatory Visit | Attending: General Surgery | Admitting: General Surgery

## 2018-11-18 ENCOUNTER — Encounter (HOSPITAL_COMMUNITY): Payer: Self-pay | Admitting: Hematology

## 2018-11-18 ENCOUNTER — Encounter (HOSPITAL_COMMUNITY): Payer: Self-pay | Admitting: *Deleted

## 2018-11-18 VITALS — BP 144/80 | HR 104 | Temp 98.0°F | Resp 18 | Wt 202.8 lb

## 2018-11-18 DIAGNOSIS — Z8041 Family history of malignant neoplasm of ovary: Secondary | ICD-10-CM

## 2018-11-18 DIAGNOSIS — Z803 Family history of malignant neoplasm of breast: Secondary | ICD-10-CM

## 2018-11-18 DIAGNOSIS — K219 Gastro-esophageal reflux disease without esophagitis: Secondary | ICD-10-CM | POA: Diagnosis not present

## 2018-11-18 DIAGNOSIS — M129 Arthropathy, unspecified: Secondary | ICD-10-CM

## 2018-11-18 DIAGNOSIS — C220 Liver cell carcinoma: Secondary | ICD-10-CM | POA: Diagnosis not present

## 2018-11-18 DIAGNOSIS — F329 Major depressive disorder, single episode, unspecified: Secondary | ICD-10-CM | POA: Diagnosis not present

## 2018-11-18 DIAGNOSIS — B1920 Unspecified viral hepatitis C without hepatic coma: Secondary | ICD-10-CM

## 2018-11-18 DIAGNOSIS — L988 Other specified disorders of the skin and subcutaneous tissue: Secondary | ICD-10-CM | POA: Insufficient documentation

## 2018-11-18 DIAGNOSIS — R161 Splenomegaly, not elsewhere classified: Secondary | ICD-10-CM | POA: Diagnosis not present

## 2018-11-18 DIAGNOSIS — R1011 Right upper quadrant pain: Secondary | ICD-10-CM | POA: Insufficient documentation

## 2018-11-18 DIAGNOSIS — I10 Essential (primary) hypertension: Secondary | ICD-10-CM | POA: Diagnosis not present

## 2018-11-18 DIAGNOSIS — E876 Hypokalemia: Secondary | ICD-10-CM | POA: Insufficient documentation

## 2018-11-18 DIAGNOSIS — Z5112 Encounter for antineoplastic immunotherapy: Secondary | ICD-10-CM

## 2018-11-18 DIAGNOSIS — Z7189 Other specified counseling: Secondary | ICD-10-CM | POA: Insufficient documentation

## 2018-11-18 DIAGNOSIS — Z79899 Other long term (current) drug therapy: Secondary | ICD-10-CM | POA: Diagnosis not present

## 2018-11-18 DIAGNOSIS — C7800 Secondary malignant neoplasm of unspecified lung: Secondary | ICD-10-CM | POA: Diagnosis not present

## 2018-11-18 LAB — COMPREHENSIVE METABOLIC PANEL
ALT: 21 U/L (ref 0–44)
AST: 45 U/L — ABNORMAL HIGH (ref 15–41)
Albumin: 3.4 g/dL — ABNORMAL LOW (ref 3.5–5.0)
Alkaline Phosphatase: 83 U/L (ref 38–126)
Anion gap: 12 (ref 5–15)
BUN: 9 mg/dL (ref 6–20)
CO2: 23 mmol/L (ref 22–32)
Calcium: 9.1 mg/dL (ref 8.9–10.3)
Chloride: 99 mmol/L (ref 98–111)
Creatinine, Ser: 0.66 mg/dL (ref 0.44–1.00)
GFR calc Af Amer: >60 mL/min (ref >60)
GFR calc non Af Amer: >60 mL/min (ref >60)
Glucose, Bld: 95 mg/dL (ref 70–99)
Potassium: 3 mmol/L — ABNORMAL LOW (ref 3.5–5.1)
Sodium: 134 mmol/L — ABNORMAL LOW (ref 135–145)
Total Bilirubin: 1.5 mg/dL — ABNORMAL HIGH (ref 0.3–1.2)
Total Protein: 8.4 g/dL — ABNORMAL HIGH (ref 6.5–8.1)

## 2018-11-18 LAB — CBC WITH DIFFERENTIAL/PLATELET
Abs Immature Granulocytes: 0.03 10*3/uL (ref 0.00–0.07)
Basophils Absolute: 0.1 10*3/uL (ref 0.0–0.1)
Basophils Relative: 1 %
Eosinophils Absolute: 0.1 10*3/uL (ref 0.0–0.5)
Eosinophils Relative: 1 %
HCT: 35.4 % — ABNORMAL LOW (ref 36.0–46.0)
Hemoglobin: 11.3 g/dL — ABNORMAL LOW (ref 12.0–15.0)
Immature Granulocytes: 0 %
Lymphocytes Relative: 11 %
Lymphs Abs: 1 10*3/uL (ref 0.7–4.0)
MCH: 28.6 pg (ref 26.0–34.0)
MCHC: 31.9 g/dL (ref 30.0–36.0)
MCV: 89.6 fL (ref 80.0–100.0)
Monocytes Absolute: 0.8 10*3/uL (ref 0.1–1.0)
Monocytes Relative: 10 %
Neutro Abs: 6.6 10*3/uL (ref 1.7–7.7)
Neutrophils Relative %: 77 %
Platelets: 162 10*3/uL (ref 150–400)
RBC: 3.95 MIL/uL (ref 3.87–5.11)
RDW: 13.8 % (ref 11.5–15.5)
WBC: 8.5 10*3/uL (ref 4.0–10.5)
nRBC: 0 % (ref 0.0–0.2)

## 2018-11-18 MED ORDER — AMLODIPINE BESYLATE 5 MG PO TABS: 5.0000 mg | ORAL_TABLET | Freq: Every day | ORAL | 3 refills | Status: DC

## 2018-11-18 NOTE — Progress Notes (Signed)
START OFF PATHWAY REGIMEN - Other Dx   [id number]:Atezolizumab 1,200 mg IV D1 + Bevacizumab 15 mg/kg IV D1 q21 Days:   A cycle is every 21 Days:     Atezolizumab      Bevacizumab-xxxx   **Always confirm dose/schedule in your pharmacy ordering system**  Patient Characteristics: Intent of Therapy: Non-Curative / Palliative Intent, Discussed with Patient 

## 2018-11-18 NOTE — Progress Notes (Signed)
Alicia Harmon,  [id number]   CLINIC:  Medical Oncology/Hematology  PCP:  Dettinger, Fransisca Kaufmann, MD [place] [address] [phone]   REASON FOR VISIT:  Follow-up for Hepatocellular cancer      INTERVAL HISTORY:  Alicia Harmon 58 y.o. female returns for routine follow-up. She is here today alone. She states that she has been feeling ok since her last visit. She is taking the pain medication only when she needs it. Denies any nausea, vomiting, or diarrhea. Denies any new pains. Had not noticed any recent bleeding such as epistaxis, hematuria or hematochezia. Denies recent chest pain on exertion, shortness of breath on minimal exertion, pre-syncopal episodes, or palpitations. Denies any numbness or tingling in hands or feet. Denies any recent fevers, infections, or recent hospitalizations. Patient reports appetite at 100% and energy level at 50%.    REVIEW OF SYSTEMS:  Review of Systems  Gastrointestinal: Positive for abdominal pain.  All other systems reviewed and are negative.    PAST MEDICAL/SURGICAL HISTORY:  Past Medical History:  Diagnosis Date  . Allergy   . Anxiety   . Arthritis   . Blood transfusion without reported diagnosis   . Cancer ([place]) 1994   pre-cervical  . Depression   . GERD (gastroesophageal reflux disease)   . Hepatitis C   . Hypertension   . Substance abuse ([place])    past cocaine user; smokes marijuana now   Past Surgical History:  Procedure Laterality Date  . CHOLECYSTECTOMY       SOCIAL HISTORY:  Social History   Socioeconomic History  . Marital status: Single    Spouse name: Not on file  . Number of children: 5  . Years of education: Not on file  . Highest education level: Not on file  Occupational History  . Occupation: SSI  Social Needs  . Financial resource strain: Very hard  . Food insecurity:    Worry: Sometimes true    Inability: Sometimes true  . Transportation needs:     Medical: No    Non-medical: No  Tobacco Use  . Smoking status: Former Smoker    Packs/day: 0.25    Years: 40.00    Pack years: 10.00  . Smokeless tobacco: Never Used  Substance and Sexual Activity  . Alcohol use: Not Currently    Alcohol/week: 3.0 standard drinks    Types: 3 Cans of beer per week    Comment: 6 pack on the weekend sometimes. No etoh during the weeks.   . Drug use: Yes    Types: Marijuana    Comment: 1 every other day  . Sexual activity: Never    Birth control/protection: Condom  Lifestyle  . Physical activity:    Days per week: Not on file    Minutes per session: Not on file  . Stress: Not on file  Relationships  . Social connections:    Talks on phone: Not on file    Gets together: Not on file    Attends religious service: Not on file    Active member of club or organization: Not on file    Attends meetings of clubs or organizations: Not on file    Relationship status: Not on file  . Intimate partner violence:    Fear of current or ex partner: Not on file    Emotionally abused: Not on file    Physically abused: Not on file    Forced sexual activity: Not  on file  Other Topics Concern  . Not on file  Social History Narrative  . Not on file    FAMILY HISTORY:  Family History  Problem Relation Age of Onset  . Diabetes Mother   . Heart disease Mother   . Mental illness Sister   . Mental illness Brother   . Mental illness Brother   . Alcohol abuse Brother   . Alcohol abuse Brother     CURRENT MEDICATIONS:  Outpatient Encounter Medications as of 11/18/2018  Medication Sig  . chlorthalidone (HYGROTON) 25 MG tablet Take 1 tablet (25 mg total) by mouth daily.  . cyclobenzaprine (FLEXERIL) 10 MG tablet Take 1 tablet (10 mg total) by mouth 3 (three) times daily as needed for muscle spasms.  Marland Kitchen HYDROcodone-acetaminophen (NORCO/VICODIN) 5-325 MG tablet Take 1 tablet by mouth every 8 (eight) hours as needed for moderate pain.  Marland Kitchen omeprazole (PRILOSEC) 20  MG capsule Take 1 capsule (20 mg total) by mouth daily.  Marland Kitchen triamcinolone (KENALOG) 0.025 % ointment APPLY AS DIRECTED TWICE DAILY.  . valACYclovir (VALTREX) 1000 MG tablet TAKE ONE TABLET BY MOUTH TWICE DAILY  . [DISCONTINUED] cyclobenzaprine (FLEXERIL) 10 MG tablet TAKE ONE TABLET BY MOUTH THREE TIMES DAILY AS NEEDED FOR MUSCLE SPASMS.  Marland Kitchen amLODipine (NORVASC) 5 MG tablet Take 1 tablet (5 mg total) by mouth daily.  . [DISCONTINUED] omeprazole (PRILOSEC) 20 MG capsule Take by mouth.  . [DISCONTINUED] triamcinolone (KENALOG) 0.025 % ointment APPLY AS DIRECTED TWICE DAILY.   No facility-administered encounter medications on file as of 11/18/2018.     ALLERGIES:  Allergies  Allergen Reactions  . Lisinopril Hives     PHYSICAL EXAM:  ECOG Performance status: 1  Vitals:   11/18/18 0907  BP: (!) 144/80  Pulse: (!) 104  Resp: 18  Temp: 98 F (36.7 C)  SpO2: 98%   Filed Weights   11/18/18 0907  Weight: 202 lb 12.8 oz (92 kg)    Physical Exam Constitutional:      Appearance: Normal appearance.  Cardiovascular:     Rate and Rhythm: Normal rate and regular rhythm.     Heart sounds: Normal heart sounds.  Pulmonary:     Effort: Pulmonary effort is normal.     Breath sounds: Normal breath sounds.  Abdominal:     Palpations: Abdomen is soft. There is mass.     Tenderness: There is abdominal tenderness.  Musculoskeletal:        General: No swelling.  Skin:    General: Skin is warm.  Neurological:     General: No focal deficit present.     Mental Status: She is alert and oriented to person, place, and time.  Psychiatric:        Mood and Affect: Mood normal.        Behavior: Behavior normal.      LABORATORY DATA:  I have reviewed the labs as listed.  CBC    Component Value Date/Time   WBC 8.5 11/18/2018 1057   RBC 3.95 11/18/2018 1057   HGB 11.3 (L) 11/18/2018 1057   HGB 13.9 09/23/2018 1405   HCT 35.4 (L) 11/18/2018 1057   HCT 41.8 09/23/2018 1405   PLT 162  11/18/2018 1057   PLT 238 09/23/2018 1405   MCV 89.6 11/18/2018 1057   MCV 92 09/23/2018 1405   MCH 28.6 11/18/2018 1057   MCHC 31.9 11/18/2018 1057   RDW 13.8 11/18/2018 1057   RDW 12.2 09/23/2018 1405   LYMPHSABS 1.0 11/18/2018 1057  LYMPHSABS 2.2 09/23/2018 1405   MONOABS 0.8 11/18/2018 1057   EOSABS 0.1 11/18/2018 1057   EOSABS 0.3 09/23/2018 1405   BASOSABS 0.1 11/18/2018 1057   BASOSABS 0.1 09/23/2018 1405   CMP Latest Ref Rng & Units 11/18/2018 11/17/2018 10/24/2018  Glucose 70 - 99 mg/dL 95 - -  BUN 6 - 20 mg/dL 9 - -  Creatinine 0.44 - 1.00 mg/dL 0.66 0.60 -  Sodium 135 - 145 mmol/L 134(L) - -  Potassium 3.5 - 5.1 mmol/L 3.0(L) - -  Chloride 98 - 111 mmol/L 99 - -  CO2 22 - 32 mmol/L 23 - -  Calcium 8.9 - 10.3 mg/dL 9.1 - -  Total Protein 6.5 - 8.1 g/dL 8.4(H) - 7.9  Total Bilirubin 0.3 - 1.2 mg/dL 1.5(H) - 0.9  Alkaline Phos 38 - 126 U/L 83 - -  AST 15 - 41 U/L 45(H) - 35  ALT 0 - 44 U/L 21 - 22       DIAGNOSTIC IMAGING:  I have independently reviewed the scans and discussed with the patient.   I have reviewed Alicia Lick LPN's note and agree with the documentation.  I personally performed a face-to-face visit, made revisions and my assessment and plan is as follows.    ASSESSMENT & PLAN:   Hepatocellular carcinoma ([place]) 1.  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 - An ultrasound of the abdomen on 10/11/2018 done at [facility] shows large mass in the right hepatic lobe measuring 13 x 7 x 10 cm. - Evaluated by Dr.Rehman with MRI on 11/01/2018 showing large hypervascular mass in the right lobe of the liver measuring 7.6 x 7.8 x 15.6 cm.  There are innumerable other smaller hypervascular lesions most compatible with satellite foci of [place].  The main tumor extends into  the right hepatic vein to the level of the IVC, and a portion of this thrombus demonstrates enhancement, compatible with tumor thrombus.  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  No intra-/extrahepatic biliary ductal dilation.  Spleen is mildly enlarged at 14.3 cm.  Multiple prominent borderline enlarged and mildly enlarged upper abdominal lymph nodes noted, largest in the hepatoduodenal ligament measuring 11 mm in short axis. - AFP was 2.5.  Child's class A with total points 5. - I discussed the results of the CT CAP dated 11/17/2018.  There are about 18 pulmonary nodules scattered randomly in both lungs, largest measuring 0.9 cm.  There is right hepatic lobe mass measuring 10.8 x 8.1 cm.  Other scattered hypoenhancing nodules are present in the liver mostly in the right hepatic lobe.  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  Bone scan was negative. 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  I have considered anticoagulation but was reluctant to do it because of her varices and potential bleed. -I have also called and talked to interventional radiology to see if any local interventions can be done for the tumor thrombus.  I was told there is none. - Today we talked about initiating her on systemic therapy.  We talked about IMbrave 150 study which  showed Atezolizumab 1200 mg IV  and bevacizumab 15 mg/kg every 3 weeks had a better six-month PFS rate of 55% and OS of 85% and better patient related quality of life outcomes compared to standard treatment sorafenib.  We talked about the most common side effects including hypertension, diarrhea, decreased appetite, elevated liver enzymes, proteinuria and infusion related reactions.  Treatment intent is palliative and discussed with patient. -I have recommended a  port placement.  We will likely start her treatment next week.  I will see her back in 3 weeks to see how she is tolerating. -She has elevated blood pressure of 144.  She is on chlorthalidone 25 mg daily.  I have called in a prescription for Norvasc 5 mg daily.  She will take it if the blood pressure becomes elevated.  2.  Right upper quadrant pain: - She has constant sharp pain in the right upper quadrant sometimes radiating to the right breast. -We have given refill for hydrocodone 5 mg every 6 hours as needed.  She is taking once a day which is helping.   3.  Family history: -Maternal grandmother and 2 maternal aunts had stomach cancer. -One maternal uncle had prostate cancer.  Total time spent is 40 minutes with more than 50% of the time spent face-to-face discussing treatment options, side effects and coordination of care.    Orders placed this encounter:  Orders Placed This Encounter  Procedures  . CBC with Differential/Platelet  . Comprehensive metabolic panel      Derek Jack, MD [place] [phone]

## 2018-11-18 NOTE — Progress Notes (Signed)
I met with patient during office visit today and provided her with my contact information along with written educational material on the two immunotherapy drugs presented to her by Dr. Delton Coombes.  Patient was provided the opportunity to ask questions and all were answered to her satisfaction. I told her that she would get a more detailed teaching session with our educator before her first treatment.  She verbalizes understanding and will bring any questions she has with her at that time.

## 2018-11-18 NOTE — Patient Instructions (Addendum)
[place] at [facility] Discharge Instructions  You were seen today by Dr. Delton Coombes. He went over your recent scan results, your bone scan was normal. Your PET scan showed small spots in your lungs. He wants to star you on Tecentriq and avastin. He will see you back in 4 weeks for labs, treatment and follow up.   Thank you for choosing [place] at [facility] to provide your oncology and hematology care.  To afford each patient quality time with our provider, please arrive at least 15 minutes before your scheduled appointment time.   If you have a lab appointment with the [facility] please come in thru the  Main Entrance and check in at the main information desk  You need to re-schedule your appointment should you arrive 10 or more minutes late.  We strive to give you quality time with our providers, and arriving late affects you and other patients whose appointments are after yours.  Also, if you no show three or more times for appointments you may be dismissed from the clinic at the providers discretion.     Again, thank you for choosing [facility].  Our hope is that these requests will decrease the amount of time that you wait before being seen by our physicians.       _____________________________________________________________  Should you have questions after your visit to [facility], please contact our office at [phone] between the hours of 8:00 a.m. and 4:30 p.m.  Voicemails left after 4:00 p.m. will not be returned until the following business day.  For prescription refill requests, have your pharmacy contact our office and allow 72 hours.    Cancer Center Support Programs:   > Cancer Support Group  2nd Tuesday of the month 1pm-2pm, Journey Room

## 2018-11-18 NOTE — Assessment & Plan Note (Addendum)
1.  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 - An ultrasound of the abdomen on 10/11/2018 done at [facility] shows large mass in the right hepatic lobe measuring 13 x 7 x 10 cm. - Evaluated by Dr.Rehman with MRI on 11/01/2018 showing large hypervascular mass in the right lobe of the liver measuring 7.6 x 7.8 x 15.6 cm.  There are innumerable other smaller hypervascular lesions most compatible with satellite foci of [place].  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  No intra-/extrahepatic biliary ductal dilation.  Spleen is mildly enlarged at 14.3 cm.  Multiple prominent borderline enlarged and mildly enlarged upper abdominal lymph nodes noted, largest in the hepatoduodenal ligament measuring 11 mm in short axis. - AFP was 2.5.  Child's class A with total points 5. - I discussed the results of the CT CAP dated 11/17/2018.  There are about 18 pulmonary nodules scattered randomly in both lungs, largest measuring 0.9 cm.  There is right hepatic lobe mass measuring 10.8 x 8.1 cm.  Other scattered hypoenhancing nodules are present in the liver mostly in the right hepatic lobe.  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  Bone scan was negative. 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  I have considered anticoagulation but was reluctant to do it because of her varices and potential bleed. -I have also called and talked to interventional radiology to see if any  local interventions can be done for the tumor thrombus.  I was told there is none. - Today we talked about initiating her on systemic therapy.  We talked about IMbrave 150 study which showed Atezolizumab 1200 mg IV  and bevacizumab 15 mg/kg every 3 weeks had a better six-month PFS rate of 55% and OS of 85% and better patient related quality of life outcomes compared to standard treatment sorafenib.  We talked about the most common side effects including hypertension, diarrhea, decreased appetite, elevated liver enzymes, proteinuria and infusion related reactions.  Treatment intent is palliative and discussed with patient. -I have recommended a port placement.  We will likely start her treatment next week.  I will see her back in 3 weeks to see how she is tolerating. -She has elevated blood pressure of 144.  She is on chlorthalidone 25 mg daily.  I have called in a prescription for Norvasc 5 mg daily.  She will take it if the blood pressure becomes elevated.  2.  Right upper quadrant pain: - She has constant sharp pain in the right upper quadrant sometimes radiating to the right breast. -We have given refill for hydrocodone 5 mg every 6 hours as needed.  She is taking once a day which is helping.   3.  Family history: -Maternal grandmother and 2 maternal aunts had stomach cancer. -One maternal uncle had prostate cancer.

## 2018-11-21 ENCOUNTER — Encounter (HOSPITAL_COMMUNITY): Payer: Self-pay | Admitting: *Deleted

## 2018-11-21 ENCOUNTER — Ambulatory Visit (HOSPITAL_COMMUNITY): Payer: Medicaid Other

## 2018-11-21 ENCOUNTER — Inpatient Hospital Stay (HOSPITAL_COMMUNITY): Payer: Medicaid Other

## 2018-11-21 ENCOUNTER — Ambulatory Visit (HOSPITAL_COMMUNITY)
Admission: RE | Admit: 2018-11-21 | Discharge: 2018-11-21 | Disposition: A | Discharge status: Home / Self Care | Payer: Medicaid Other | Attending: General Surgery | Admitting: General Surgery

## 2018-11-21 ENCOUNTER — Encounter (HOSPITAL_COMMUNITY)
Admission: RE | Disposition: A | Discharge status: Home / Self Care | Payer: Self-pay | Source: Home / Self Care | Attending: General Surgery

## 2018-11-21 ENCOUNTER — Other Ambulatory Visit: Payer: Self-pay

## 2018-11-21 ENCOUNTER — Ambulatory Visit (HOSPITAL_COMMUNITY): Payer: Medicaid Other | Admitting: Anesthesiology

## 2018-11-21 DIAGNOSIS — C220 Liver cell carcinoma: Secondary | ICD-10-CM | POA: Insufficient documentation

## 2018-11-21 DIAGNOSIS — Z87891 Personal history of nicotine dependence: Secondary | ICD-10-CM | POA: Diagnosis not present

## 2018-11-21 DIAGNOSIS — Z95828 Presence of other vascular implants and grafts: Secondary | ICD-10-CM

## 2018-11-21 DIAGNOSIS — Z79899 Other long term (current) drug therapy: Secondary | ICD-10-CM | POA: Diagnosis not present

## 2018-11-21 DIAGNOSIS — I10 Essential (primary) hypertension: Secondary | ICD-10-CM | POA: Diagnosis not present

## 2018-11-21 DIAGNOSIS — K219 Gastro-esophageal reflux disease without esophagitis: Secondary | ICD-10-CM | POA: Insufficient documentation

## 2018-11-21 HISTORY — PX: PORTACATH PLACEMENT: SHX2246

## 2018-11-21 SURGERY — INSERTION, TUNNELED CENTRAL VENOUS DEVICE, WITH PORT
Anesthesia: Monitor Anesthesia Care | Site: Chest | Laterality: Left

## 2018-11-21 MED ORDER — PROPOFOL 10 MG/ML IV BOLUS
INTRAVENOUS | Status: DC | PRN
  Administered 2018-11-21 (×2): 15 mg via INTRAVENOUS

## 2018-11-21 MED ORDER — MIDAZOLAM HCL 2 MG/2ML IJ SOLN: 0.5000 mg | Freq: Once | INTRAMUSCULAR | Status: DC | PRN

## 2018-11-21 MED ORDER — LACTATED RINGERS IV SOLN
INTRAVENOUS | Status: DC
  Administered 2018-11-21: 08:00:00 1000 mL via INTRAVENOUS

## 2018-11-21 MED ORDER — MIDAZOLAM HCL 2 MG/2ML IJ SOLN
INTRAMUSCULAR | Status: AC
  Filled 2018-11-21: qty 2

## 2018-11-21 MED ORDER — KETOROLAC TROMETHAMINE 30 MG/ML IJ SOLN
30.0000 mg | Freq: Once | INTRAMUSCULAR | Status: AC
  Administered 2018-11-21: 30 mg via INTRAVENOUS
  Filled 2018-11-21: qty 1

## 2018-11-21 MED ORDER — HEPARIN SOD (PORK) LOCK FLUSH 100 UNIT/ML IV SOLN
INTRAVENOUS | Status: AC
  Filled 2018-11-21: qty 5

## 2018-11-21 MED ORDER — FENTANYL CITRATE (PF) 100 MCG/2ML IJ SOLN
INTRAMUSCULAR | Status: DC | PRN
  Administered 2018-11-21 (×2): 25 ug via INTRAVENOUS

## 2018-11-21 MED ORDER — CHLORHEXIDINE GLUCONATE CLOTH 2 % EX PADS: 6.0000 | MEDICATED_PAD | Freq: Once | CUTANEOUS | Status: DC

## 2018-11-21 MED ORDER — PROPOFOL 10 MG/ML IV BOLUS
INTRAVENOUS | Status: AC
  Filled 2018-11-21: qty 40

## 2018-11-21 MED ORDER — PROMETHAZINE HCL 25 MG/ML IJ SOLN: 6.2500 mg | INTRAMUSCULAR | Status: DC | PRN

## 2018-11-21 MED ORDER — CEFAZOLIN SODIUM-DEXTROSE 2-4 GM/100ML-% IV SOLN
2.0000 g | INTRAVENOUS | Status: AC
  Administered 2018-11-21: 2 g via INTRAVENOUS
  Filled 2018-11-21: qty 100

## 2018-11-21 MED ORDER — MIDAZOLAM HCL 5 MG/5ML IJ SOLN
INTRAMUSCULAR | Status: DC | PRN
  Administered 2018-11-21: 2 mg via INTRAVENOUS

## 2018-11-21 MED ORDER — SODIUM CHLORIDE (PF) 0.9 % IJ SOLN
INTRAMUSCULAR | Status: DC | PRN
  Administered 2018-11-21: 500 mL

## 2018-11-21 MED ORDER — PROPOFOL 500 MG/50ML IV EMUL
INTRAVENOUS | Status: DC | PRN
  Administered 2018-11-21: 35 ug/kg/min via INTRAVENOUS

## 2018-11-21 MED ORDER — HEPARIN SOD (PORK) LOCK FLUSH 100 UNIT/ML IV SOLN
INTRAVENOUS | Status: DC | PRN
  Administered 2018-11-21: 500 [IU] via INTRAVENOUS

## 2018-11-21 MED ORDER — LIDOCAINE HCL (PF) 1 % IJ SOLN
INTRAMUSCULAR | Status: DC | PRN
  Administered 2018-11-21: 10 mL

## 2018-11-21 MED ORDER — FENTANYL CITRATE (PF) 100 MCG/2ML IJ SOLN
INTRAMUSCULAR | Status: AC
  Filled 2018-11-21: qty 6

## 2018-11-21 MED ORDER — HYDROMORPHONE HCL 1 MG/ML IJ SOLN: 0.2500 mg | INTRAMUSCULAR | Status: DC | PRN

## 2018-11-21 MED ORDER — LIDOCAINE HCL (PF) 1 % IJ SOLN
INTRAMUSCULAR | Status: AC
  Filled 2018-11-21: qty 30

## 2018-11-21 MED ORDER — HYDROCODONE-ACETAMINOPHEN 7.5-325 MG PO TABS: 1.0000 | ORAL_TABLET | Freq: Once | ORAL | Status: DC | PRN

## 2018-11-21 SURGICAL SUPPLY — 29 items
BAG DECANTER FOR FLEXI CONT (MISCELLANEOUS) ×3 IMPLANT
CHLORAPREP W/TINT 10.5 ML (MISCELLANEOUS) ×3 IMPLANT
CLOTH BEACON ORANGE TIMEOUT ST (SAFETY) ×3 IMPLANT
COVER LIGHT HANDLE STERIS (MISCELLANEOUS) ×6 IMPLANT
COVER WAND RF STERILE (DRAPES) ×3 IMPLANT
DECANTER SPIKE VIAL GLASS SM (MISCELLANEOUS) ×3 IMPLANT
DERMABOND ADVANCED (GAUZE/BANDAGES/DRESSINGS) ×2
DERMABOND ADVANCED .7 DNX12 (GAUZE/BANDAGES/DRESSINGS) ×1 IMPLANT
DRAPE C-ARM FOLDED MOBILE STRL (DRAPES) ×3 IMPLANT
ELECT REM PT RETURN 9FT ADLT (ELECTROSURGICAL) ×3
ELECTRODE REM PT RTRN 9FT ADLT (ELECTROSURGICAL) ×1 IMPLANT
GLOVE BIOGEL M 7.0 STRL (GLOVE) ×3 IMPLANT
GLOVE BIOGEL PI IND STRL 7.0 (GLOVE) ×2 IMPLANT
GLOVE BIOGEL PI INDICATOR 7.0 (GLOVE) ×4
GLOVE SURG SS PI 7.5 STRL IVOR (GLOVE) ×3 IMPLANT
GOWN STRL REUS W/TWL LRG LVL3 (GOWN DISPOSABLE) ×6 IMPLANT
IV NS 500ML (IV SOLUTION) ×2
IV NS 500ML BAXH (IV SOLUTION) ×1 IMPLANT
KIT PORT POWER 8FR ISP MRI (Port) ×3 IMPLANT
KIT TURNOVER KIT A (KITS) ×3 IMPLANT
NEEDLE HYPO 25X1 1.5 SAFETY (NEEDLE) ×3 IMPLANT
PACK MINOR (CUSTOM PROCEDURE TRAY) ×3 IMPLANT
PAD ARMBOARD 7.5X6 YLW CONV (MISCELLANEOUS) ×3 IMPLANT
SET BASIN LINEN APH (SET/KITS/TRAYS/PACK) ×3 IMPLANT
SUT MNCRL AB 4-0 PS2 18 (SUTURE) ×3 IMPLANT
SUT VIC AB 3-0 SH 27 (SUTURE) ×2
SUT VIC AB 3-0 SH 27X BRD (SUTURE) ×1 IMPLANT
SYR 5ML LL (SYRINGE) ×3 IMPLANT
SYR CONTROL 10ML LL (SYRINGE) ×3 IMPLANT

## 2018-11-21 NOTE — Addendum Note (Signed)
Addendum  created 11/21/18 0924 by Charmaine Downs, CRNA   Intraprocedure Flowsheets edited, Intraprocedure Meds edited

## 2018-11-21 NOTE — Anesthesia Preprocedure Evaluation (Signed)
Anesthesia Evaluation  Patient identified by MRN, date of birth, ID band Patient awake    Reviewed: Allergy & Precautions, NPO status , Patient's Chart, lab work & pertinent test results  Airway Mallampati: II  TM Distance: >3 FB Neck ROM: Full    Dental no notable dental hx. (+) Teeth Intact   Pulmonary neg pulmonary ROS, former smoker,    Pulmonary exam normal breath sounds clear to auscultation       Cardiovascular Exercise Tolerance: Good hypertension, Pt. on medications negative cardio ROS Normal cardiovascular examI Rhythm:Regular Rate:Normal     Neuro/Psych Anxiety Depression Bipolar Disorder negative neurological ROS  negative psych ROS   GI/Hepatic GERD  Medicated and Controlled,(+) Hepatitis -Liver mets    Endo/Other  negative endocrine ROS  Renal/GU negative Renal ROS  negative genitourinary   Musculoskeletal  (+) Arthritis , Osteoarthritis,    Abdominal   Peds negative pediatric ROS (+)  Hematology negative hematology ROS (+)   Anesthesia Other Findings   Reproductive/Obstetrics negative OB ROS                             Anesthesia Physical Anesthesia Plan  ASA: III  Anesthesia Plan: MAC   Post-op Pain Management:    Induction: Intravenous  PONV Risk Score and Plan:   Airway Management Planned: Simple Face Mask and Nasal Cannula  Additional Equipment:   Intra-op Plan:   Post-operative Plan: Extubation in OR  Informed Consent: I have reviewed the patients History and Physical, chart, labs and discussed the procedure including the risks, benefits and alternatives for the proposed anesthesia with the patient or authorized representative who has indicated his/her understanding and acceptance.     Dental advisory given  Plan Discussed with: CRNA  Anesthesia Plan Comments: (Plan full PPE use )        Anesthesia Quick Evaluation

## 2018-11-21 NOTE — Op Note (Signed)
Patient:  Alicia Harmon  DOB:  [birth year]  MRN:  [id number]   Preop Diagnosis: Hepatocellular carcinoma, need for central venous access  Postop Diagnosis: Same  Procedure: Port-A-Cath insertion  Surgeon: Aviva Signs, MD  Anes: MAC  Indications: Patient is a 58 year old white female with hepatocellular carcinoma who is about to undergo chemotherapy.  The risks and benefits of the procedure including bleeding, infection, and pneumothorax were fully explained to the patient, who gave informed consent.  Procedure note: The patient was placed in Trendelenburg position after the left upper chest was prepped and draped using usual sterile technique with ChloraPrep.  Surgical site confirmation was performed.  1% Xylocaine was used for local anesthesia.  An incision was made below the left clavicle.  A subcutaneous pocket was formed.  A needle was advanced into the left subclavian vein using the Seldinger technique without difficulty.  A guidewire was then advanced into the right atrium under fluoroscopic guidance.  An introducer and peel-away sheath were placed over the guidewire.  The catheter was then inserted through the peel-away sheath and the peel-away sheath was removed.  The catheter was then attached to the port and the port placed in subcutaneous pocket.  Adequate positioning was confirmed by fluoroscopy.  Good backflow of venous blood was noted on aspiration of the port.  The port was flushed with heparin flush.  The subcutaneous layer was reapproximated using a 3-0 Vicryl interrupted suture.  The skin was closed using a 4-0 Monocryl subcuticular suture.  Dermabond was applied.  All tape and needle counts were correct at the end of the procedure.  The patient was transferred to PACU in stable condition.  A chest x-ray will be performed at that time.  Complications: None  EBL: Minimal  Specimen: None

## 2018-11-21 NOTE — Anesthesia Postprocedure Evaluation (Signed)
Anesthesia Post Note  Patient: Alicia Harmon  Procedure(s) Performed: INSERTION PORT-A-CATH (attached catheter left subclavian) (Left Chest)  Patient location during evaluation: PACU Anesthesia Type: MAC Level of consciousness: awake and patient cooperative Pain management: pain level controlled Vital Signs Assessment: post-procedure vital signs reviewed and stable Respiratory status: spontaneous breathing, nonlabored ventilation and respiratory function stable Cardiovascular status: blood pressure returned to baseline Postop Assessment: no apparent nausea or vomiting Anesthetic complications: no     Last Vitals:  Vitals:   11/21/18 0645  BP: 129/83  Resp: (!) 23  Temp: 36.7 C  SpO2: 94%    Last Pain:  Vitals:   11/21/18 0645  TempSrc: Oral  PainSc: 0-No pain                 Fiana Gladu J

## 2018-11-21 NOTE — Discharge Instructions (Signed)
PATIENT INSTRUCTIONS °POST-ANESTHESIA ° °IMMEDIATELY FOLLOWING SURGERY:  Do not drive or operate machinery for the first twenty four hours after surgery.  Do not make any important decisions for twenty four hours after surgery or while taking narcotic pain medications or sedatives.  If you develop intractable nausea and vomiting or a severe headache please notify your doctor immediately. ° °FOLLOW-UP:  Please make an appointment with your surgeon as instructed. You do not need to follow up with anesthesia unless specifically instructed to do so. ° °WOUND CARE INSTRUCTIONS (if applicable):  Keep a dry clean dressing on the anesthesia/puncture wound site if there is drainage.  Once the wound has quit draining you may leave it open to air.  Generally you should leave the bandage intact for twenty four hours unless there is drainage.  If the epidural site drains for more than 36-48 hours please call the anesthesia department. ° °QUESTIONS?:  Please feel free to call your physician or the hospital operator if you have any questions, and they will be happy to assist you.    ° ° °Implanted Port Home Guide °An implanted port is a device that is placed under the skin. It is usually placed in the chest. The device can be used to give IV medicine, to take blood, or for dialysis. You may have an implanted port if: °· You need IV medicine that would be irritating to the small veins in your hands or arms. °· You need IV medicines, such as antibiotics, for a long period of time. °· You need IV nutrition for a long period of time. °· You need dialysis. °Having a port means that your health care provider will not need to use the veins in your arms for these procedures. You may have fewer limitations when using a port than you would if you used other types of long-term IVs, and you will likely be able to return to normal activities after your incision heals. °An implanted port has two main parts: °· Reservoir. The reservoir is the  part where a needle is inserted to give medicines or draw blood. The reservoir is round. After it is placed, it appears as a small, raised area under your skin. °· Catheter. The catheter is a thin, flexible tube that connects the reservoir to a vein. Medicine that is inserted into the reservoir goes into the catheter and then into the vein. °How is my port accessed? °To access your port: °· A numbing cream may be placed on the skin over the port site. °· Your health care provider will put on a mask and sterile gloves. °· The skin over your port will be cleaned carefully with a germ-killing soap and allowed to dry. °· Your health care provider will gently pinch the port and insert a needle into it. °· Your health care provider will check for a blood return to make sure the port is in the vein and is not clogged. °· If your port needs to remain accessed to get medicine continuously (constant infusion), your health care provider will place a clear bandage (dressing) over the needle site. The dressing and needle will need to be changed every week, or as told by your health care provider. °What is flushing? °Flushing helps keep the port from getting clogged. Follow instructions from your health care provider about how and when to flush the port. Ports are usually flushed with saline solution or a medicine called heparin. The need for flushing will depend on how the port   is used: °· If the port is only used from time to time to give medicines or draw blood, the port may need to be flushed: °? Before and after medicines have been given. °? Before and after blood has been drawn. °? As part of routine maintenance. Flushing may be recommended every 4-6 weeks. °· If a constant infusion is running, the port may not need to be flushed. °· Throw away any syringes in a disposal container that is meant for sharp items (sharps container). You can buy a sharps container from a pharmacy, or you can make one by using an empty hard  plastic bottle with a cover. °How long will my port stay implanted? °The port can stay in for as long as your health care provider thinks it is needed. When it is time for the port to come out, a surgery will be done to remove it. The surgery will be similar to the procedure that was done to put the port in. °Follow these instructions at home: ° °· Flush your port as told by your health care provider. °· If you need an infusion over several days, follow instructions from your health care provider about how to take care of your port site. Make sure you: °? Wash your hands with soap and water before you change your dressing. If soap and water are not available, use alcohol-based hand sanitizer. °? Change your dressing as told by your health care provider. °? Place any used dressings or infusion bags into a plastic bag. Throw that bag in the trash. °? Keep the dressing that covers the needle clean and dry. Do not get it wet. °? Do not use scissors or sharp objects near the tube. °? Keep the tube clamped, unless it is being used. °· Check your port site every day for signs of infection. Check for: °? Redness, swelling, or pain. °? Fluid or blood. °? Pus or a bad smell. °· Protect the skin around the port site. °? Avoid wearing bra straps that rub or irritate the site. °? Protect the skin around your port from seat belts. Place a soft pad over your chest if needed. °· Bathe or shower as told by your health care provider. The site may get wet as long as you are not actively receiving an infusion. °· Return to your normal activities as told by your health care provider. Ask your health care provider what activities are safe for you. °· Carry a medical alert card or wear a medical alert bracelet at all times. This will let health care providers know that you have an implanted port in case of an emergency. °Get help right away if: °· You have redness, swelling, or pain at the port site. °· You have fluid or blood coming from  your port site. °· You have pus or a bad smell coming from the port site. °· You have a fever. °Summary °· Implanted ports are usually placed in the chest for long-term IV access. °· Follow instructions from your health care provider about flushing the port and changing bandages (dressings). °· Take care of the area around your port by avoiding clothing that puts pressure on the area, and by watching for signs of infection. °· Protect the skin around your port from seat belts. Place a soft pad over your chest if needed. °· Get help right away if you have a fever or you have redness, swelling, pain, drainage, or a bad smell at the port site. °This   information is not intended to replace advice given to you by your health care provider. Make sure you discuss any questions you have with your health care provider. °Document Released: 08/03/2005 Document Revised: 09/05/2016 Document Reviewed: 09/05/2016 °Elsevier Interactive Patient Education © 2019 [employer] ° °

## 2018-11-21 NOTE — H&P (Signed)
Alicia Harmon is an 58 y.o. female.   Chief Complaint: Hepatocellular carcinoma, need for central venous access HPI: Patient is a 58 year old white female who was referred to my care by oncology for Port-A-Cath placement.  She is about to undergo chemotherapy for hepatocellular carcinoma.  She currently has 0 out of 10 pain.  Past Medical History:  Diagnosis Date  . Allergy   . Anxiety   . Arthritis   . Blood transfusion without reported diagnosis   . 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 Depression   . GERD (gastroesophageal reflux disease)   . Hepatitis C   . Hypertension   . Substance abuse ([place])    past cocaine user; smokes marijuana now    Past Surgical History:  Procedure Laterality Date  . CHOLECYSTECTOMY      Family History  Problem Relation Age of Onset  . Diabetes Mother   . Heart disease Mother   . Mental illness Sister   . Mental illness Brother   . Mental illness Brother   . Alcohol abuse Brother   . Alcohol abuse Brother    Social History:  reports that she has quit smoking. She has a 10.00 pack-year smoking history. She has never used smokeless tobacco. She reports previous alcohol use of about 3.0 standard drinks of alcohol per week. She reports current drug use. Drug: Marijuana.  Allergies:  Allergies  Allergen Reactions  . Lisinopril Hives    Medications Prior to Admission  Medication Sig Dispense Refill  . amLODipine (NORVASC) 5 MG tablet Take 1 tablet (5 mg total) by mouth daily. 30 tablet 3  . chlorthalidone (HYGROTON) 25 MG tablet Take 1 tablet (25 mg total) by mouth daily. 90 tablet 3  . cyclobenzaprine (FLEXERIL) 10 MG tablet Take 1 tablet (10 mg total) by mouth 3 (three) times daily as needed for muscle spasms. 60 tablet 3  . HYDROcodone-acetaminophen (NORCO/VICODIN) 5-325 MG tablet Take 1 tablet by mouth every 8 (eight) hours as needed for moderate pain. 42 tablet 0  . omeprazole (PRILOSEC) 20 MG capsule Take 1 capsule (20 mg total) by mouth  daily. 90 capsule 3  . triamcinolone (KENALOG) 0.025 % ointment APPLY AS DIRECTED TWICE DAILY.    . valACYclovir (VALTREX) 1000 MG tablet TAKE ONE TABLET BY MOUTH TWICE DAILY 60 tablet 2    No results found for this or any previous visit (from the past 48 hour(s)). No results found.  Review of Systems  Constitutional: Positive for malaise/fatigue.  HENT: Negative.   Eyes: Negative.   Respiratory: Negative.   Cardiovascular: Negative.   Gastrointestinal: Negative.   Genitourinary: Negative.   Musculoskeletal: Negative.   Skin: Negative.   Neurological: Negative.   Endo/Heme/Allergies: Negative.   Psychiatric/Behavioral: Negative.     Blood pressure 129/83, temperature 98.1 F (36.7 C), temperature source Oral, resp. rate (!) 23, height 5\' 4"  (1.626 m), weight 91.6 kg, SpO2 94 %. Physical Exam  Vitals reviewed. Constitutional: She is oriented to person, place, and time. She appears well-developed and well-nourished. No distress.  HENT:  Head: Normocephalic and atraumatic.  Cardiovascular: Normal rate, regular rhythm and normal heart sounds. Exam reveals no gallop and no friction rub.  No murmur heard. Respiratory: Effort normal and breath sounds normal. No respiratory distress. She has no wheezes. She has no rales.  Neurological: She is alert and oriented to person, place, and time.  Skin: Skin is warm and dry.    Oncology notes reviewed Assessment/Plan Impression: Hepatocellular carcinoma, need for  central venous access Patient is scheduled for a Port-A-Cath insertion today.  The risks and benefits of the procedure including bleeding, infection, and pneumothorax were fully explained to the patient, who gave informed consent.  Aviva Signs, MD 11/21/2018, 6:55 AM

## 2018-11-21 NOTE — Transfer of Care (Signed)
Immediate Anesthesia Transfer of Care Note  Patient: Tassie Pollett  Procedure(s) Performed: INSERTION PORT-A-CATH (attached catheter left subclavian) (Left Chest)  Patient Location: PACU  Anesthesia Type:MAC  Level of Consciousness: awake and patient cooperative  Airway & Oxygen Therapy: Patient Spontanous Breathing and Patient connected to nasal cannula oxygen  Post-op Assessment: Report given to RN, Post -op Vital signs reviewed and stable and Patient moving all extremities  Post vital signs: Reviewed and stable  Last Vitals:  Vitals Value Taken Time  BP    Temp    Pulse    Resp    SpO2      Last Pain:  Vitals:   11/21/18 0645  TempSrc: Oral  PainSc: 0-No pain      Patients Stated Pain Goal: 5 (34/03/70 9643)  Complications: No apparent anesthesia complications

## 2018-11-21 NOTE — Interval H&P Note (Signed)
History and Physical Interval Note:  11/21/2018 6:58 AM  Alicia Harmon  has presented today for surgery, with the diagnosis of hepatocelluar cancer.  The various methods of treatment have been discussed with the patient and family. After consideration of risks, benefits and other options for treatment, the patient has consented to  Procedure(s): INSERTION PORT-A-CATH (N/A) as a surgical intervention.  The patient's history has been reviewed, patient examined, no change in status, stable for surgery.  I have reviewed the patient's chart and labs.  Questions were answered to the patient's satisfaction.     Aviva Signs

## 2018-11-22 ENCOUNTER — Encounter (HOSPITAL_COMMUNITY): Payer: Self-pay | Admitting: General Surgery

## 2018-11-22 ENCOUNTER — Other Ambulatory Visit: Payer: Self-pay

## 2018-11-22 NOTE — Patient Instructions (Signed)
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  We will treat you with two different immunotherapy medications - atezolizumab (Tecentriq) and bevacizumab (Avastin).  These are medications that are targeted to help your body's immune system recognize cancer cells and kill them.  You will receive both medications in the clinic every 3 weeks.  Each of these medications is given IV and take 30 minutes each to infuse. The intent of treatment is to control your cancer and relieve any symptoms you may be having related to the cancer.  You will see the doctor regularly throughout treatment.  We monitor your lab work prior to every treatment. The doctor monitors your response to treatment by the way you are feeling, your blood work, and scans periodically.  There will be wait times while you are here for treatment.  It will take about 30 minutes to 1 hour for your lab work to result.  Then there will be wait times while pharmacy mixes your medications.   Atezolizumab Gildardo Pounds)  About This Drug Huey Bienenstock is used to treat cancer. It is given by the vein (IV).  Possible Side Effects . Nausea . Tiredness and weakness . Decreased appetite (decreased hunger) . Cough . Trouble breathing  Note: Each of the side effects above was reported in 20% or greater of patients treated with atezolizumab. Your side effects may be different if you are taking atezolizumab in combination with another agent. Not all possible side effects are included above.  Warnings and Precautions . This drug works with your immune system and can cause inflammation (swelling) in any of your organs and tissues and can change how they work. This may put you at risk for developing serious medical problems, which can be life-threatening. . Inflammation of the lungs  which can be life-threatening. You may have a dry cough or trouble breathing. . Severe changes in your liver function which can cause liver failure and be life-threatening. . Colitis which is swelling in the colon. The symptoms are diarrhea, stomach cramping, and sometimes blood in the bowel movements. . Changes in your central nervous system can happen. The central nervous system is made up of your brain and spinal cord. You could feel extreme tiredness, agitation, confusion, hallucinations (see or hear things that are not there), trouble understanding or speaking, loss of control of your bowels or bladder, eyesight changes, numbness or lack of strength to your arms, legs, face, or body, and coma. If you start to have any of these symptoms let your doctor know right away. . This drug may affect your hormone glands (thyroid, adrenals, pituitary and pancreas). . Blood sugar levels may change, and you may develop diabetes. If you already have diabetes, changes may need to be made to your diabetes medication. . Severe infections, including viral, bacterial and fungal, which can be life-threatening . While you are getting this drug in your vein (IV), you may have a reaction to the drug. Sometimes you may be given medication to stop or lessen these side effects. Your nurse will check you closely for these signs: fever or shaking chills, flushing, facial swelling, feeling dizzy, headache, trouble breathing, rash, itching, chest tightness, or chest pain. These reactions may happen after your infusion. If this happens, call 911 for emergency care.  Note: Some of the side effects above are very rare. If  you have concerns and/or questions, please discuss them with your medical team.  Important Information . This drug may be present in the saliva, tears, sweat, urine, stool, vomit, semen, and vaginal secretions. Talk to your doctor and/or your nurse about the necessary precautions to take during this  time.  Treating Side Effects . Manage tiredness by pacing your activities for the day. . Be sure to include periods of rest between energy-draining activities. . To decrease the risk of infection, wash your hands regularly. . Avoid close contact with people who have a cold, the flu, or other infections. . Take your temperature as your doctor or nurse tells you, and whenever you feel like you may have a fever. . Drink plenty of fluids (a minimum of eight glasses per day is recommended). . Ask your doctor or nurse about medicine that is available to help stop or lessen loose bowel movements. . To help with nausea, eat small, frequent meals instead of three large meals a day. Choose foods and drinks that are at room temperature. Ask your nurse or doctor about other helpful tips and medicine that is available to help stop or lessen these symptoms. . To help with decreased appetite, eat small, frequent meals. Eat foods high in calories and protein, such as meat, poultry, fish, dry beans, tofu, eggs, nuts, milk, yogurt, cheese, ice cream, pudding, and nutritional supplements. . Consider using sauces and spices to increase taste. Daily exercise, with your doctor's approval, may increase your appetite. . If you have diabetes, keep good control of your blood sugar level. Tell your nurse or your doctor if your glucose levels are higher or lower than normal. . Keeping your pain under control is important to your well-being. Please tell your doctor or nurse if you are experiencing pain. . If you get a rash do not put anything on it unless your doctor or nurse says you may. Keep the area around the rash clean and dry. Ask your doctor for medicine if your rash bothers you. . If you have numbness and tingling in your hands and feet, be careful when cooking, walking, and handling sharp objects and hot liquids. . Infusion reactions may happen after your infusion. If this happens, call 911 for emergency  care.  Food and Drug Interactions . There are no known interactions of atezolizumab with food. . This drug may interact with other medicines. Tell your doctor and pharmacist about all the prescription and over-the-counter medicines and dietary supplements (vitamins, minerals, herbs and others) that you are taking at this time. Also, check with your doctor or pharmacist before starting any new prescription or over-the-counter medicines, or dietary supplements to make sure that there are no interactions.  When to Call the Doctor  Call your doctor or nurse if you have any of these symptoms and/or any new or unusual symptoms:  . Fever of 100.4 F (38 C) or higher . Chills . Tiredness that interferes with your daily activities . Feeling dizzy or lightheaded . Pain in your chest . Dry cough . Coughing up yellow, green, or bloody mucus . Wheezing or trouble breathing . Feeling that your heart is beating in a fast or not normal way (palpitations) . Confusion and/or agitation . Hallucinations . Trouble understanding or speaking . Numbness or lack of strength to your arms, legs, face, or body . Blurred vision or other changes in eyesight . Diarrhea, 4 times in one day or diarrhea with lack of strength or a feeling of being dizzy .  Pain in your abdomen that does not go away . Blood in your stool . Nausea that stops you from eating or drinking and/or is not relieved by prescribed medicines . Throwing up more than 3 times a day . Lasting loss of appetite or rapid weight loss of five pounds in a week . Abnormal blood sugar . Unusual thirst, passing urine often, headache, sweating, shakiness, irritability . Pain that does not go away, or is not relieved by prescribed medicines . Numbness, tingling, or pain your hands and feet . Extreme weakness that interferes with normal activities . A new rash or a rash that is not relieved by prescribed medicines . Signs of infusion reaction: fever or  shaking chills, flushing, facial swelling, feeling dizzy, headache, trouble breathing, rash, itching, chest tightness, or chest pain. If this happens, call 911 for emergency care. . Signs of possible liver problems: dark urine, pale bowel movements, bad stomach pain, feeling very tired and weak, unusual itching, or yellowing of the eyes or skin . If you think you may be pregnant  Reproduction Warnings  . Pregnancy warning: This drug can have harmful effects on the unborn baby. Women of childbearing potential should use effective methods of birth control during your cancer treatment and for at least 5 months after treatment. Let your doctor know right away if you think you may be pregnant.  . Breastfeeding warning: It is not known if this drug passes into breast milk. For this reason, Women should not breastfeed during treatment and for at least 5 months after treatment because this drug could enter the breast milk and cause harm to a breastfeeding baby.  . Fertility warning: In women, this drug may affect your ability to have children in the future. Talk with your doctor or nurse if you plan to have children. Ask for information on egg banking.   Bevacizumab (Avastin)  About This Drug Bevacizumab is used to treat cancer. It is given in the vein (IV).  Possible Side Effects . Teary eyes . Runny/stuffy nose . Nosebleed . Changes in the way food and drinks taste . Headache . Back pain . Protein in your urine . Bleeding in your rectum . Dry skin . A red skin rash which can be peeling or scaling . High blood pressure  Note: Each of the side effects above was reported in 10% or greater of patients treated with bevacizumab-xxxx. Not all possible side effects are included above.  Warnings and Precautions  . Perforation or fistula- an abnormal hole in your stomach, intestine, esophagus, or other organ, which can be life-threatening . Slow wound healing, which can be  life-threatening . Abnormal bleeding which can be life-threatening - symptoms may be coughing up blood, throwing up blood (may look like coffee grounds), red or black tarry bowel movements, abnormally heavy menstrual flow, nosebleeds or any other unusual bleeding. . Blood clots and events such as stroke and heart attack. A blood clot in your leg may cause your leg to swell, appear red and warm, and/or cause pain. A blood clot in your lungs may cause trouble breathing, pain when breathing, and/or chest pain. . Severe high blood pressure . Changes in your central nervous system can happen. The central nervous system is made up of your brain and spinal cord. You could feel extreme tiredness, agitation, confusion, hallucinations . This drug may interact with other medicines. Tell your doctor and pharmacist about all the medicines and dietary supplements (vitamins, minerals, herbs and others) that you are  taking at this time. Also, check with your doctor or pharmacist before starting any new prescription or over-thecounter medicines, or dietary supplements to make sure that there are no interactions.  When to Call the Doctor Call your doctor or nurse if you have any of these symptoms and/or any new or unusual symptoms:  . Fever of 100.4 F (38 C) or higher . Chills . Confusion and/or agitation . Hallucinations . Trouble understanding or speaking . Headache that does not go away . Nose bleed that doesn't stop bleeding after 10 -15 minutes . Feeling dizzy or lightheaded . Blurry vision or changes in your eyesight . Difficulty swallowing . Easy bleeding or bruising . Blood in your urine, vomit (bright red or coffee-ground) and/or stools ( bright red, or black/tarry) . Coughing up blood . Wheezing and/or trouble breathing . Chest pain or symptoms of a heart attack. Most heart attacks involve pain in the center of the chest that lasts more than a few minutes. The pain may go away and come  back. It can feel like pressure, squeezing, fullness, or pain. Sometimes pain is felt in one or both arms, the back, neck, jaw, or stomach. If any of these symptoms last 2 minutes, call 911. Marland Kitchen Symptoms of a stroke such as sudden numbness or weakness of your face, arm, or leg, mostly on one side of your body; sudden confusion, trouble speaking or understanding; sudden trouble seeing in one or both eyes; sudden trouble walking, feeling dizzy, loss of balance or coordination; or sudden, bad headache with no known cause. If you have any of these symptoms for 2 minutes, call 911. . Numbness or lack of strength to your arms, legs, face, or body . Nausea that stops you from eating or drinking and/or relieved by prescribed medicine . Throwing up more than 3 times a day . Pain in your abdomen that does not go away . Foamy or bubbly-looking urine . Signs of infusion reaction: fever or shaking chills, flushing, facial swelling, feeling dizzy, headache, trouble breathing, rash, itching, chest tightness, or chest pain. . Pain that does not go away or is not relieved by prescribed medicine . Your leg or arm is swollen, red, warm and/or painful . Swelling of arms, hands, legs and/or feet . Weight gain of 5 pounds in one week (fluid retention) . If you think you may be pregnant  Reproduction Warnings . Pregnancy warning: This drug can have harmful effects on the unborn baby. Women of child bearing potential should use effective methods of birth control during your cancer treatment and for 6 months after treatment. In women, changes in your ovaries may happen that may cause menstrual bleeding to become irregular or stop, do not assume you cannot get pregnant. Let your doctor know right away if you think you may be pregnant . Breastfeeding warning: Women should not breastfeed during treatment and for 6 months after treatment because this drug could enter the breast milk and cause harm to a breastfeeding  baby. . Fertility warning: In women, this drug may affect your ability to have children in the future. Talk with your doctor or nurse if you plan to have children. Ask for information on egg banking.   SELF CARE ACTIVITIES WHILE ON IMMUNOTHERAPY:  Hydration Increase your fluid intake 48 hours prior to treatment and drink at least 8 to 12 cups (64 ounces) of water/decaffeinated beverages per day after treatment. You can still have your cup of coffee or soda but these beverages do not  count as part of your 8 to 12 cups that you need to drink daily. No alcohol intake.  Medications Continue taking your normal prescription medication as prescribed.  If you start any new herbal or new supplements please let us know first to make sure it is safe.  Mouth Care Have teeth cleaned professionally before starting treatment. Keep dentures and partial plates clean. Use soft toothbrush and do not use mouthwashes that contain alcohol. Biotene is a good mouthwash that is available at most pharmacies or may be ordered by calling [phone]. Use warm salt water gargles (1 teaspoon salt per 1 quart warm water) before and after meals and at bedtime. Or you may rinse with 2 tablespoons of three-percent hydrogen peroxide mixed in eight ounces of water. If you are still having problems with your mouth or sores in your mouth please call the clinic. If you need dental work, please let the doctor know before you go for your appointment so that we can coordinate the best possible time for you in regards to your chemo regimen. You need to also let your dentist know that you are actively taking chemo. We may need to do labs prior to your dental appointment.  Skin Care Always use sunscreen that has not expired and with SPF (Sun Protection Factor) of 50 or higher. Wear hats to protect your head from the sun. Remember to use sunscreen on your hands, ears, face, & feet.  Use good moisturizing lotions such as udder cream,  eucerin, or even Vaseline. Some chemotherapies can cause dry skin, color changes in your skin and nails.    . Avoid long, hot showers or baths. . Use gentle, fragrance-free soaps and laundry detergent. . Use moisturizers, preferably creams or ointments rather than lotions because the thicker consistency is better at preventing skin dehydration. Apply the cream or ointment within 15 minutes of showering. Reapply moisturizer at night, and moisturize your hands every time after you wash them.   Infection Prevention Please wash your hands for at least 30 seconds using warm soapy water. Handwashing is the #1 way to prevent the spread of germs. Stay away from sick people or people who are getting over a cold. If you develop respiratory systems such as green/yellow mucus production or productive cough or persistent cough let us know and we will see if you need an antibiotic. It is a good idea to keep a pair of gloves on when going into grocery stores/[employer] to decrease your risk of coming into contact with germs on the carts, etc. Carry alcohol hand gel with you at all times and use it frequently if out in public. If your temperature reaches 100.5 or higher please call the clinic and let us know.  If it is after hours or on the weekend please go to the ER if your temperature is over 100.4.  Please have your own personal thermometer at home to use.    Sex and bodily fluids If you are going to have sex, a condom must be used to protect the person that isn't taking chemotherapy. Chemo can decrease your libido (sex drive). For a few days after chemotherapy, chemotherapy can be excreted through your bodily fluids.  When using the toilet please close the lid and flush the toilet twice.  Do this for a few day after you have had chemotherapy.   Contraception It's important to use reliable contraception during treatment. Avoid getting pregnant while you or your partner are having chemotherapy. This is because  the  drugs may harm the baby. Sometimes chemotherapy drugs can leave a man or woman infertile.  This means you would not be able to have children in the future. You might want to talk to someone about permanent infertility. It can be very difficult to learn that you may no longer be able to have children. Some people find counselling helpful. There might be ways to preserve your fertility, although this is easier for men than for women. You may want to speak to a fertility expert. You can talk about sperm banking or harvesting your eggs. You can also ask about other fertility options, such as donor eggs. If you have or have had breast cancer, your doctor might advise you not to take the contraceptive pill. This is because the hormones in it might affect the cancer.  It is not known for sure whether or not chemotherapy drugs can be passed on through semen or secretions from the vagina. Because of this some doctors advise people to use a barrier method if you have sex during treatment. This applies to vaginal, anal or oral sex. Generally, doctors advise a barrier method only for the time you are actually having the treatment and for about a week after your treatment. Advice like this can be worrying, but this does not mean that you have to avoid being intimate with your partner. You can still have close contact with your partner and continue to enjoy sex.  Animals If you have cats or birds we just ask that you not change the litter or change the cage.  Please have someone else do this for you while you are on chemotherapy.   Food Safety During and After Cancer Treatment Food safety is important for people both during and after cancer treatment. Cancer and cancer treatments, such as chemotherapy, radiation therapy, and stem cell/bone marrow transplantation, often weaken the immune system. This makes it harder for your body to protect itself from foodborne illness, also called food poisoning. Foodborne illness is  caused by eating food that contains harmful bacteria, parasites, or viruses.  Foods to avoid Some foods have a higher risk of becoming tainted with bacteria. These include: Marland Kitchen Unwashed fresh fruit and vegetables, especially leafy vegetables that can hide dirt and other contaminants . Raw sprouts, such as alfalfa sprouts . Raw or undercooked beef, especially ground beef, or other raw or undercooked meat and poultry . Fatty, fried, or spicy foods immediately before or after treatment.  These can sit heavy on your stomach and make you feel nauseous. . Raw or undercooked shellfish, such as oysters. . Sushi and sashimi, which often contain raw fish.  . Unpasteurized beverages, such as unpasteurized fruit juices, raw milk, raw yogurt, or cider . Undercooked eggs, such as soft boiled, over easy, and poached; raw, unpasteurized eggs; or foods made with raw egg, such as homemade raw cookie dough and homemade mayonnaise  Simple steps for food safety  Shop smart. . Do not buy food stored or displayed in an unclean area. . Do not buy bruised or damaged fruits or vegetables. . Do not buy cans that have cracks, dents, or bulges. . Pick up foods that can spoil at the end of your shopping trip and store them in a cooler on the way home.  Prepare and clean up foods carefully. . Rinse all fresh fruits and vegetables under running water, and dry them with a clean towel or paper towel. . Clean the top of cans before opening them. Marland Kitchen  After preparing food, wash your hands for 20 seconds with hot water and soap. Pay special attention to areas between fingers and under nails. . Clean your utensils and dishes with hot water and soap. Marland Kitchen Disinfect your kitchen and cutting boards using 1 teaspoon of liquid, unscented bleach mixed into 1 quart of water.    Dispose of old food. . Eat canned and packaged food before its expiration date (the "use by" or "best before" date). . Consume refrigerated leftovers within 3 to 4  days. After that time, throw out the food. Even if the food does not smell or look spoiled, it still may be unsafe. Some bacteria, such as Listeria, can grow even on foods stored in the refrigerator if they are kept for too long.  Take precautions when eating out. . At restaurants, avoid buffets and salad bars where food sits out for a long time and comes in contact with many people. Food can become contaminated when someone with a virus, often a norovirus, or another "bug" handles it. . Put any leftover food in a "to-go" container yourself, rather than having the server do it. And, refrigerate leftovers as soon as you get home. . Choose restaurants that are clean and that are willing to prepare your food as you order it cooked.     SYMPTOMS TO REPORT AS SOON AS POSSIBLE AFTER TREATMENT:   FEVER GREATER THAN 100.5 F  CHILLS WITH OR WITHOUT FEVER  NAUSEA AND VOMITING THAT IS NOT CONTROLLED WITH YOUR NAUSEA MEDICATION  UNUSUAL SHORTNESS OF BREATH  UNUSUAL BRUISING OR BLEEDING  TENDERNESS IN MOUTH AND THROAT WITH OR WITHOUT PRESENCE OF ULCERS  URINARY PROBLEMS  BOWEL PROBLEMS  UNUSUAL RASH      Wear comfortable clothing and clothing appropriate for easy access to any Portacath or PICC line. Let us know if there is anything that we can do to make your therapy better!    What to do if you need assistance after hours or on the weekends: CALL [phone].  HOLD on the line, do not hang up.  You will hear multiple messages but at the end you will be connected with a nurse triage line.  They will contact the doctor if necessary.  Most of the time they will be able to assist you.  Do not call the hospital operator.      I have been informed and understand all of the instructions given to me and have received a copy. I have been instructed to call the clinic [phone] or my family physician as soon as possible for continued medical care, if indicated. I do not have any more  questions at this time but understand that I may call the [place] or the Patient Navigator at [phone] during office hours should I have questions or need assistance in obtaining follow-up care.

## 2018-11-23 ENCOUNTER — Inpatient Hospital Stay (HOSPITAL_COMMUNITY): Payer: Medicaid Other

## 2018-11-23 ENCOUNTER — Encounter (HOSPITAL_COMMUNITY): Payer: Self-pay

## 2018-11-23 VITALS — BP 130/70 | HR 88 | Temp 98.7°F | Resp 18 | Wt 208.9 lb

## 2018-11-23 DIAGNOSIS — C220 Liver cell carcinoma: Secondary | ICD-10-CM

## 2018-11-23 DIAGNOSIS — E876 Hypokalemia: Secondary | ICD-10-CM

## 2018-11-23 DIAGNOSIS — Z5112 Encounter for antineoplastic immunotherapy: Secondary | ICD-10-CM | POA: Diagnosis not present

## 2018-11-23 LAB — URINALYSIS, DIPSTICK ONLY
Bilirubin Urine: NEGATIVE
Glucose, UA: NEGATIVE mg/dL
Hgb urine dipstick: NEGATIVE
Ketones, ur: NEGATIVE mg/dL
Nitrite: NEGATIVE
Protein, ur: NEGATIVE mg/dL
Specific Gravity, Urine: 1.008 (ref 1.005–1.030)
pH: 7 (ref 5.0–8.0)

## 2018-11-23 MED ORDER — HEPARIN SOD (PORK) LOCK FLUSH 100 UNIT/ML IV SOLN
500.0000 [IU] | Freq: Once | INTRAVENOUS | Status: AC | PRN
  Administered 2018-11-23: 500 [IU]

## 2018-11-23 MED ORDER — SODIUM CHLORIDE 0.9 % IV SOLN
Freq: Once | INTRAVENOUS | Status: AC
  Administered 2018-11-23: 12:00:00 via INTRAVENOUS

## 2018-11-23 MED ORDER — SODIUM CHLORIDE 0.9% FLUSH
10.0000 mL | INTRAVENOUS | Status: DC | PRN
  Administered 2018-11-23: 12:00:00 10 mL
  Filled 2018-11-23: qty 10

## 2018-11-23 MED ORDER — POTASSIUM CHLORIDE CRYS ER 20 MEQ PO TBCR
40.0000 meq | EXTENDED_RELEASE_TABLET | Freq: Once | ORAL | Status: AC
  Administered 2018-11-23: 13:00:00 40 meq via ORAL
  Filled 2018-11-23: qty 2

## 2018-11-23 MED ORDER — SODIUM CHLORIDE 0.9 % IV SOLN
1200.0000 mg | Freq: Once | INTRAVENOUS | Status: AC
  Administered 2018-11-23: 13:00:00 1200 mg via INTRAVENOUS
  Filled 2018-11-23: qty 20

## 2018-11-23 MED ORDER — SODIUM CHLORIDE 0.9 % IV SOLN
15.2000 mg/kg | Freq: Once | INTRAVENOUS | Status: AC
  Administered 2018-11-23: 14:00:00 1400 mg via INTRAVENOUS
  Filled 2018-11-23: qty 56

## 2018-11-23 NOTE — Patient Instructions (Signed)
[facility] Discharge Instructions for Patients Receiving Chemotherapy   Beginning January 23rd 2017 lab work for the [facility] will be done in the  Main lab at [facility] on 1st floor. If you have a lab appointment with the [place] please come in thru the  Main Entrance and check in at the main information desk   Today you received the following chemotherapy agents Tecentriq and Avastin. Follow-up as scheduled. Call clinic for any questions or concerns  To help prevent nausea and vomiting after your treatment, we encourage you to take your nausea medication   If you develop nausea and vomiting, or diarrhea that is not controlled by your medication, call the clinic.  The clinic phone number is [phone]. Office hours are Monday-Friday 8:30am-5:00pm.  BELOW ARE SYMPTOMS THAT SHOULD BE REPORTED IMMEDIATELY:  *FEVER GREATER THAN 101.0 F  *CHILLS WITH OR WITHOUT FEVER  NAUSEA AND VOMITING THAT IS NOT CONTROLLED WITH YOUR NAUSEA MEDICATION  *UNUSUAL SHORTNESS OF BREATH  *UNUSUAL BRUISING OR BLEEDING  TENDERNESS IN MOUTH AND THROAT WITH OR WITHOUT PRESENCE OF ULCERS  *URINARY PROBLEMS  *BOWEL PROBLEMS  UNUSUAL RASH Items with * indicate a potential emergency and should be followed up as soon as possible. If you have an emergency after office hours please contact your primary care physician or go to the nearest emergency department.  Please call the clinic during office hours if you have any questions or concerns.   You may also contact the Patient Navigator at [phone] should you have any questions or need assistance in obtaining follow up care.      Resources For Cancer Patients and their Caregivers ? American Cancer Society: Can assist with transportation, wigs, general needs, runs Look Good Feel Better.        7152236638 ? Cancer Care: Provides financial assistance, online support groups, medication/co-pay assistance.   1-800-813-HOPE 520-118-8272) ? [place] Assists [place] cancer patients and their families through emotional , educational and financial support.  (613)301-1755 ? [place] DSS Where to apply for food stamps, Medicaid and utility assistance. 319-295-7009 ? [employer]: Transportation to medical appointments. 715-757-4990 ? Social Security Administration: May apply for disability if have a Stage IV cancer. (214)513-7209 610-582-5856 ? [employer], Disability and Transit Services: Assists with nutrition, care and transit needs. 667-113-2157

## 2018-11-23 NOTE — Progress Notes (Signed)
11/23/18  K+ level 3.0  Contacted MD and received order to add K-Dur po 40 mEq x 1 today.  V.O. Dr Rhys Martini, PharmD

## 2018-11-23 NOTE — Progress Notes (Signed)
Alicia Harmon tolerated chemo tx well without complaints or incident. Labs from 4/3 and urine protein from today as well as CXR for portacath placement reviewed prior to administering medications . Drug specific written information reviewed with and given to pt by A.Ouida Sills RN with consent signed. Pt verbalized understanding. VSS upon discharge. Pt discharged self ambulatory in satisfactory condition

## 2018-11-24 ENCOUNTER — Telehealth (HOSPITAL_COMMUNITY): Payer: Self-pay

## 2018-11-24 NOTE — Telephone Encounter (Signed)
24 HR Chemo F/U call : Spoke with pt who reported that she was doing well today. She denied any fever,chills, N+V or pain at this time. Pt instructed to call us for any problems or questions and verbalized understanding

## 2018-12-12 ENCOUNTER — Other Ambulatory Visit (HOSPITAL_COMMUNITY): Payer: Medicaid Other

## 2018-12-12 ENCOUNTER — Ambulatory Visit (HOSPITAL_COMMUNITY): Payer: Medicaid Other | Admitting: Hematology

## 2018-12-12 ENCOUNTER — Ambulatory Visit (HOSPITAL_COMMUNITY): Payer: Medicaid Other

## 2018-12-14 ENCOUNTER — Encounter (HOSPITAL_COMMUNITY): Payer: Self-pay | Admitting: Hematology

## 2018-12-14 ENCOUNTER — Inpatient Hospital Stay (HOSPITAL_COMMUNITY): Payer: Medicaid Other

## 2018-12-14 ENCOUNTER — Inpatient Hospital Stay (HOSPITAL_BASED_OUTPATIENT_CLINIC_OR_DEPARTMENT_OTHER): Payer: Medicaid Other | Admitting: Hematology

## 2018-12-14 ENCOUNTER — Other Ambulatory Visit: Payer: Self-pay

## 2018-12-14 VITALS — BP 144/90 | HR 97 | Temp 97.5°F | Resp 18

## 2018-12-14 VITALS — BP 148/82 | HR 109 | Temp 97.7°F | Resp 20 | Wt 210.0 lb

## 2018-12-14 DIAGNOSIS — R1011 Right upper quadrant pain: Secondary | ICD-10-CM

## 2018-12-14 DIAGNOSIS — C7800 Secondary malignant neoplasm of unspecified lung: Secondary | ICD-10-CM

## 2018-12-14 DIAGNOSIS — Z79899 Other long term (current) drug therapy: Secondary | ICD-10-CM

## 2018-12-14 DIAGNOSIS — C220 Liver cell carcinoma: Secondary | ICD-10-CM

## 2018-12-14 DIAGNOSIS — I10 Essential (primary) hypertension: Secondary | ICD-10-CM

## 2018-12-14 DIAGNOSIS — D693 Immune thrombocytopenic purpura: Secondary | ICD-10-CM

## 2018-12-14 DIAGNOSIS — Z5112 Encounter for antineoplastic immunotherapy: Secondary | ICD-10-CM

## 2018-12-14 DIAGNOSIS — M129 Arthropathy, unspecified: Secondary | ICD-10-CM

## 2018-12-14 DIAGNOSIS — L988 Other specified disorders of the skin and subcutaneous tissue: Secondary | ICD-10-CM

## 2018-12-14 DIAGNOSIS — R161 Splenomegaly, not elsewhere classified: Secondary | ICD-10-CM | POA: Diagnosis not present

## 2018-12-14 DIAGNOSIS — B1920 Unspecified viral hepatitis C without hepatic coma: Secondary | ICD-10-CM

## 2018-12-14 DIAGNOSIS — F329 Major depressive disorder, single episode, unspecified: Secondary | ICD-10-CM

## 2018-12-14 DIAGNOSIS — E876 Hypokalemia: Secondary | ICD-10-CM

## 2018-12-14 DIAGNOSIS — Z803 Family history of malignant neoplasm of breast: Secondary | ICD-10-CM

## 2018-12-14 DIAGNOSIS — K219 Gastro-esophageal reflux disease without esophagitis: Secondary | ICD-10-CM

## 2018-12-14 DIAGNOSIS — D801 Nonfamilial hypogammaglobulinemia: Secondary | ICD-10-CM

## 2018-12-14 DIAGNOSIS — Z8041 Family history of malignant neoplasm of ovary: Secondary | ICD-10-CM

## 2018-12-14 LAB — CBC WITH DIFFERENTIAL/PLATELET
Abs Immature Granulocytes: 0.01 10*3/uL (ref 0.00–0.07)
Abs Immature Granulocytes: 0.03 10*3/uL (ref 0.00–0.07)
Basophils Absolute: 0 10*3/uL (ref 0.0–0.1)
Basophils Absolute: 0 10*3/uL (ref 0.0–0.1)
Basophils Relative: 1 %
Basophils Relative: 1 %
Eosinophils Absolute: 0.1 10*3/uL (ref 0.0–0.5)
Eosinophils Absolute: 0.1 10*3/uL (ref 0.0–0.5)
Eosinophils Relative: 2 %
Eosinophils Relative: 2 %
HCT: 29 % — ABNORMAL LOW (ref 36.0–46.0)
HCT: 28.8 % — ABNORMAL LOW (ref 36.0–46.0)
Hemoglobin: 8.9 g/dL — ABNORMAL LOW (ref 12.0–15.0)
Hemoglobin: 9.1 g/dL — ABNORMAL LOW (ref 12.0–15.0)
Immature Granulocytes: 0 %
Immature Granulocytes: 1 %
Lymphocytes Relative: 14 %
Lymphocytes Relative: 15 %
Lymphs Abs: 0.8 10*3/uL (ref 0.7–4.0)
Lymphs Abs: 1 10*3/uL (ref 0.7–4.0)
MCH: 28.3 pg (ref 26.0–34.0)
MCH: 28.6 pg (ref 26.0–34.0)
MCHC: 30.7 g/dL (ref 30.0–36.0)
MCHC: 31.6 g/dL (ref 30.0–36.0)
MCV: 92.1 fL (ref 80.0–100.0)
MCV: 90.6 fL (ref 80.0–100.0)
Monocytes Absolute: 0.6 10*3/uL (ref 0.1–1.0)
Monocytes Absolute: 0.7 10*3/uL (ref 0.1–1.0)
Monocytes Relative: 9 %
Monocytes Relative: 11 %
Neutro Abs: 4.4 10*3/uL (ref 1.7–7.7)
Neutro Abs: 4.6 10*3/uL (ref 1.7–7.7)
Neutrophils Relative %: 74 %
Neutrophils Relative %: 70 %
Platelets: 7 10*3/uL — CL (ref 150–400)
Platelets: 5 10*3/uL — CL (ref 150–400)
RBC: 3.15 MIL/uL — ABNORMAL LOW (ref 3.87–5.11)
RBC: 3.18 MIL/uL — ABNORMAL LOW (ref 3.87–5.11)
RDW: 16.2 % — ABNORMAL HIGH (ref 11.5–15.5)
RDW: 16.2 % — ABNORMAL HIGH (ref 11.5–15.5)
WBC: 6 10*3/uL (ref 4.0–10.5)
WBC: 6.4 10*3/uL (ref 4.0–10.5)
nRBC: 0 % (ref 0.0–0.2)
nRBC: 0 % (ref 0.0–0.2)

## 2018-12-14 LAB — SAMPLE TO BLOOD BANK

## 2018-12-14 LAB — COMPREHENSIVE METABOLIC PANEL
ALT: 17 U/L (ref 0–44)
AST: 44 U/L — ABNORMAL HIGH (ref 15–41)
Albumin: 3 g/dL — ABNORMAL LOW (ref 3.5–5.0)
Alkaline Phosphatase: 62 U/L (ref 38–126)
Anion gap: 11 (ref 5–15)
BUN: 13 mg/dL (ref 6–20)
CO2: 22 mmol/L (ref 22–32)
Calcium: 8.8 mg/dL — ABNORMAL LOW (ref 8.9–10.3)
Chloride: 100 mmol/L (ref 98–111)
Creatinine, Ser: 0.75 mg/dL (ref 0.44–1.00)
GFR calc Af Amer: >60 mL/min (ref >60)
GFR calc non Af Amer: >60 mL/min (ref >60)
Glucose, Bld: 132 mg/dL — ABNORMAL HIGH (ref 70–99)
Potassium: 2.7 mmol/L — CL (ref 3.5–5.1)
Sodium: 133 mmol/L — ABNORMAL LOW (ref 135–145)
Total Bilirubin: 2.2 mg/dL — ABNORMAL HIGH (ref 0.3–1.2)
Total Protein: 7.9 g/dL (ref 6.5–8.1)

## 2018-12-14 MED ORDER — ACETAMINOPHEN 325 MG PO TABS
650.0000 mg | ORAL_TABLET | Freq: Once | ORAL | Status: AC
  Administered 2018-12-14: 650 mg via ORAL
  Filled 2018-12-14: qty 2

## 2018-12-14 MED ORDER — IMMUNE GLOBULIN (HUMAN) 5 GM/100ML IV SOLN: 1.0000 g/kg | Freq: Once | INTRAVENOUS | Status: DC

## 2018-12-14 MED ORDER — POTASSIUM CHLORIDE 10 MEQ/100ML IV SOLN
10.0000 meq | INTRAVENOUS | Status: AC
  Administered 2018-12-14: 11:00:00 10 meq via INTRAVENOUS
  Filled 2018-12-14 (×2): qty 100

## 2018-12-14 MED ORDER — SODIUM CHLORIDE 0.9% FLUSH
10.0000 mL | INTRAVENOUS | Status: DC | PRN
  Administered 2018-12-14: 08:00:00 10 mL via INTRAVENOUS
  Filled 2018-12-14: qty 10

## 2018-12-14 MED ORDER — DIPHENHYDRAMINE HCL 25 MG PO CAPS
50.0000 mg | ORAL_CAPSULE | Freq: Once | ORAL | Status: AC
  Administered 2018-12-14: 50 mg via ORAL
  Filled 2018-12-14: qty 2

## 2018-12-14 MED ORDER — DEXAMETHASONE 4 MG PO TABS: 40.0000 mg | ORAL_TABLET | Freq: Every day | ORAL | 0 refills | Status: AC

## 2018-12-14 MED ORDER — HEPARIN SOD (PORK) LOCK FLUSH 100 UNIT/ML IV SOLN
500.0000 [IU] | Freq: Once | INTRAVENOUS | Status: AC
  Administered 2018-12-14: 500 [IU] via INTRAVENOUS

## 2018-12-14 MED ORDER — HYDROCODONE-ACETAMINOPHEN 5-325 MG PO TABS
1.0000 | ORAL_TABLET | Freq: Once | ORAL | Status: AC
  Administered 2018-12-14: 1 via ORAL
  Filled 2018-12-14: qty 1

## 2018-12-14 MED ORDER — SODIUM CHLORIDE 0.9 % IV SOLN
INTRAVENOUS | Status: DC
  Administered 2018-12-14: 10:00:00 via INTRAVENOUS

## 2018-12-14 MED ORDER — DEXAMETHASONE 4 MG PO TABS: 40.0000 mg | ORAL_TABLET | Freq: Once | ORAL | Status: DC

## 2018-12-14 MED ORDER — POTASSIUM CHLORIDE CRYS ER 20 MEQ PO TBCR
40.0000 meq | EXTENDED_RELEASE_TABLET | Freq: Once | ORAL | Status: AC
  Administered 2018-12-14: 40 meq via ORAL
  Filled 2018-12-14: qty 2

## 2018-12-14 MED ORDER — IMMUNE GLOBULIN (HUMAN) 5 GM/50ML IV SOLN
95.0000 g | Freq: Once | INTRAVENOUS | Status: AC
  Administered 2018-12-14: 95 g via INTRAVENOUS
  Filled 2018-12-14: qty 50

## 2018-12-14 MED ORDER — SODIUM CHLORIDE 0.9 % IV SOLN
40.0000 mg | Freq: Once | INTRAVENOUS | Status: AC
  Administered 2018-12-14: 40 mg via INTRAVENOUS
  Filled 2018-12-14: qty 4

## 2018-12-14 MED ORDER — DEXTROSE 5 % IV SOLN
Freq: Once | INTRAVENOUS | Status: AC
  Administered 2018-12-14: 12:00:00 via INTRAVENOUS

## 2018-12-14 NOTE — Patient Instructions (Signed)
[facility] at [facility] Discharge Instructions  Labs drawn from portacath today   Thank you for choosing [facility] at [facility] to provide your oncology and hematology care.  To afford each patient quality time with our provider, please arrive at least 15 minutes before your scheduled appointment time.   If you have a lab appointment with the Cancer Center please come in thru the  Main Entrance and check in at the main information desk  You need to re-schedule your appointment should you arrive 10 or more minutes late.  We strive to give you quality time with our providers, and arriving late affects you and other patients whose appointments are after yours.  Also, if you no show three or more times for appointments you may be dismissed from the clinic at the providers discretion.     Again, thank you for choosing [facility].  Our hope is that these requests will decrease the amount of time that you wait before being seen by our physicians.       _____________________________________________________________  Should you have questions after your visit to [facility], please contact our office at [phone] between the hours of 8:00 a.m. and 4:30 p.m.  Voicemails left after 4:00 p.m. will not be returned until the following business day.  For prescription refill requests, have your pharmacy contact our office and allow 72 hours.    Cancer Center Support Programs:   > Cancer Support Group  2nd Tuesday of the month 1pm-2pm, Journey Room   

## 2018-12-14 NOTE — Progress Notes (Signed)
CRITICAL VALUE ALERT Critical value received:  Platelets Date of notification:  12/14/18 Time of notification: 0922 Critical value read back: yes Nurse who received alert:  TWJackson RN MD notified (1st page):  0930                                                                                                                                      [phone] Labs reviewed with and pt seen by Dr. Delton Coombes and pt's chemo tx to be held today. IVIG infusion to be given x 2 days as well as Potassium 10 meq IV today and tomorrow and 40 meq PO today per MD                                                                                     Alicia Harmon tolerated IVIG and potassium infusions well without complaints or incident. VSS upon discharge. Port left accessed,saline locked and flushed for use tomorrow. VSS upon discharge. Pt discharged self ambulatory in satisfactory condition

## 2018-12-14 NOTE — Patient Instructions (Signed)
[place] at [facility] Discharge Instructions  Received IVIG infusion as well as Potassium infusion. Follow-up as scheduled. Call clinic for any questions or concerns   Thank you for choosing [place] at [facility] to provide your oncology and hematology care.  To afford each patient quality time with our provider, please arrive at least 15 minutes before your scheduled appointment time.   If you have a lab appointment with the [place] please come in thru the  Main Entrance and check in at the main information desk  You need to re-schedule your appointment should you arrive 10 or more minutes late.  We strive to give you quality time with our providers, and arriving late affects you and other patients whose appointments are after yours.  Also, if you no show three or more times for appointments you may be dismissed from the clinic at the providers discretion.     Again, thank you for choosing [facility].  Our hope is that these requests will decrease the amount of time that you wait before being seen by our physicians.       _____________________________________________________________  Should you have questions after your visit to [facility], please contact our office at [phone] between the hours of 8:00 a.m. and 4:30 p.m.  Voicemails left after 4:00 p.m. will not be returned until the following business day.  For prescription refill requests, have your pharmacy contact our office and allow 72 hours.    Cancer Center Support Programs:   > Cancer Support Group  2nd Tuesday of the month 1pm-2pm, Journey Room

## 2018-12-14 NOTE — Assessment & Plan Note (Addendum)
1.  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 - An ultrasound of the abdomen on 10/11/2018 done at [facility] shows large mass in the right hepatic lobe measuring 13 x 7 x 10 cm. - Evaluated by Dr.Rehman with MRI on 11/01/2018 showing large hypervascular mass in the right lobe of the liver measuring 7.6 x 7.8 x 15.6 cm.  There are innumerable other smaller hypervascular lesions most compatible with satellite foci of [place].  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  No intra-/extrahepatic biliary ductal dilation.  Spleen is mildly enlarged at 14.3 cm.  Multiple prominent borderline enlarged and mildly enlarged upper abdominal lymph nodes noted, largest in the hepatoduodenal ligament measuring 11 mm in short axis. - AFP was 2.5.  Child's class A with total points 5. - I discussed the results of the CT CAP dated 11/17/2018.  There are about 18 pulmonary nodules scattered randomly in both lungs, largest measuring 0.9 cm.  There is right hepatic lobe mass measuring 10.8 x 8.1 cm.  Other scattered hypoenhancing nodules are present in the liver mostly in the right hepatic lobe.  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  Bone scan was negative. 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  I have considered anticoagulation but was reluctant to do it because of her varices and potential bleed. -I have also called and talked to interventional radiology to see if any  local interventions can be done for the tumor thrombus.  I was told there is none. - Based on IMbrave 150 study which showed a better six-month PFS rate of 55% and overall survival rate of 85% and better patient related quality of life outcomes, combination Atezolizumab and bevacizumab was recommended.  -Cycle 1 of Atezolizumab and bevacizumab on 11/23/2018. -She has developed small ulcerated lesions on her anterior abdominal wall and back.  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  She is on Valtrex prophylaxis.  We will consider sending culture from the lesions. - CBC today showed platelet count of 7.  This was 5 on rechecking.  This is highly unlikely from Atezolizumab or bevacizumab.  There is likely immune related platelet destruction.  We will check her LDH.  We will review her smear. -I have recommended holding her therapy today.  We will give her a trial of IVIG 1 g/kg x 2 days.  I will also give her dexamethasone 40 mg x 4 days.  We discussed side effects in detail. -We will recheck a CBC and LDH tomorrow.  She has severe hypokalemia with potassium of 2.7.  Will repleted.  2.  Right upper quadrant pain: - She has constant sharp pain in the right upper quadrant sometimes radiating to the right breast. -We have given refill for hydrocodone 5 mg every 6 hours as needed.  She is taking once a day which is helping.   3.  Family history: -Maternal grandmother and 2  maternal aunts had stomach cancer. -One maternal uncle had prostate cancer.  4.  Hypertension: -He is on chlorthalidone 25 mg daily.

## 2018-12-14 NOTE — Patient Instructions (Addendum)
[place] at [facility] Discharge Instructions  You were seen today by Dr. Delton Coombes. He went over your recent lab results. He will see you back tomorrow,  Friday and Monday for labs and follow up.   Thank you for choosing [place] at [facility] to provide your oncology and hematology care.  To afford each patient quality time with our provider, please arrive at least 15 minutes before your scheduled appointment time.   If you have a lab appointment with the [place] please come in thru the  Main Entrance and check in at the main information desk  You need to re-schedule your appointment should you arrive 10 or more minutes late.  We strive to give you quality time with our providers, and arriving late affects you and other patients whose appointments are after yours.  Also, if you no show three or more times for appointments you may be dismissed from the clinic at the providers discretion.     Again, thank you for choosing [facility].  Our hope is that these requests will decrease the amount of time that you wait before being seen by our physicians.       _____________________________________________________________  Should you have questions after your visit to [facility], please contact our office at [phone] between the hours of 8:00 a.m. and 4:30 p.m.  Voicemails left after 4:00 p.m. will not be returned until the following business day.  For prescription refill requests, have your pharmacy contact our office and allow 72 hours.    Cancer Center Support Programs:   > Cancer Support Group  2nd Tuesday of the month 1pm-2pm, Journey Room

## 2018-12-14 NOTE — Progress Notes (Signed)
CRITICAL VALUE STICKER  CRITICAL VALUE: platelets 7 and Potassium 2.7  RECEIVER (on-site recipient of call): Charlyne Petrin, rn  DATE & TIME NOTIFIED: 12/14/2018 at 0847  MESSENGER (representative from lab): Hillary Flynt and Derrill Memo  MD NOTIFIED: Delton Coombes  TIME OF NOTIFICATION:0847  RESPONSE: to be seen today by oncologist.

## 2018-12-14 NOTE — Progress Notes (Signed)
[address], [place] [address]   CLINIC:  Medical Oncology/Hematology  PCP:  Dettinger, Fransisca Kaufmann, MD [place] [id number] [phone]   REASON FOR VISIT:  Follow-up for Hepatocellular cancer   BRIEF ONCOLOGIC HISTORY:    Hepatocellular carcinoma ([place])   11/15/2018 Initial Diagnosis    Hepatocellular carcinoma ([place])    11/23/2018 -  Chemotherapy    The patient had bevacizumab (AVASTIN) 1,400 mg in sodium chloride 0.9 % 100 mL chemo infusion, 15.2 mg/kg = 1,375 mg, Intravenous,  Once, 1 of 6 cycles Administration: 1,400 mg (11/23/2018) atezolizumab (TECENTRIQ) 1,200 mg in sodium chloride 0.9 % 250 mL chemo infusion, 1,200 mg, Intravenous, Once, 1 of 6 cycles Administration: 1,200 mg (11/23/2018)  for chemotherapy treatment.       CANCER STAGING: Cancer Staging No matching staging information was found for the patient.   INTERVAL HISTORY:  Alicia Harmon 58 y.o. female returns for routine follow-up and consideration for next cycle of chemotherapy. She is here today alone. She states that she has noticed little sores on her abdomin and lower back.  They are sometimes itchy and painful.  She had scratched them.  She is taking Valtrex twice daily.  She had diarrhea which has gotten better.  Appetite is 100% and energy levels are 75%.  She has mild fatigue.  Denies any fevers.  Denies any new medications.    REVIEW OF SYSTEMS:  Review of Systems  Gastrointestinal: Positive for diarrhea.  Skin: Positive for itching and rash.  All other systems reviewed and are negative.    PAST MEDICAL/SURGICAL HISTORY:  Past Medical History:  Diagnosis Date  . Allergy   . Anxiety   . Arthritis   . Blood transfusion without reported diagnosis   . Cancer ([place]) 1994   pre-cervical  . Depression   . GERD (gastroesophageal reflux disease)   . Hepatitis C   . Hypertension   . Substance abuse ([place])    past cocaine user; smokes marijuana now   Past Surgical History:  Procedure Laterality Date  . CHOLECYSTECTOMY    . PORTACATH PLACEMENT Left 11/21/2018   Procedure: INSERTION PORT-A-CATH (attached catheter left subclavian);  Surgeon: Aviva Signs, MD;  Location: [facility];  Service: General;  Laterality: Left;     SOCIAL HISTORY:  Social History   Socioeconomic History  . Marital status: Single    Spouse name: Not on file  . Number of children: 5  . Years of education: Not on file  . Highest education level: Not on file  Occupational History  . Occupation: SSI  Social Needs  . Financial resource strain: Very hard  . Food insecurity:    Worry: Sometimes true    Inability: Sometimes true  . Transportation needs:    Medical: No    Non-medical: No  Tobacco Use  . Smoking status: Former Smoker    Packs/day: 0.25    Years: 40.00    Pack years: 10.00  . Smokeless tobacco: Never Used  Substance and Sexual Activity  . Alcohol use: Not Currently    Alcohol/week: 3.0 standard drinks    Types: 3 Cans of beer per week    Comment: 6 pack on the weekend sometimes. No etoh during the weeks.   . Drug use: Yes    Types: Marijuana    Comment: 1 every other day  . Sexual activity: Never    Birth control/protection: Condom  Lifestyle  . Physical activity:  Days per week: Not on file    Minutes per session: Not on file  . Stress: Not on file  Relationships  . Social connections:    Talks on phone: Not on file    Gets together: Not on file    Attends religious service: Not on file    Active member of club or organization: Not on file    Attends meetings of clubs or organizations: Not on file    Relationship status: Not on file  . Intimate partner violence:    Fear of current or ex partner: Not on file    Emotionally abused: Not on file    Physically abused: Not on file    Forced sexual activity: Not on file  Other Topics Concern  . Not on file  Social History Narrative  . Not on file    FAMILY HISTORY:  Family  History  Problem Relation Age of Onset  . Diabetes Mother   . Heart disease Mother   . Mental illness Sister   . Mental illness Brother   . Mental illness Brother   . Alcohol abuse Brother   . Alcohol abuse Brother     CURRENT MEDICATIONS:  Outpatient Encounter Medications as of 12/14/2018  Medication Sig  . amLODipine (NORVASC) 5 MG tablet Take 1 tablet (5 mg total) by mouth daily.  Huey Bienenstock (TECENTRIQ IV) Inject 1,200 mg into the vein every 21 ( twenty-one) days.  . Bevacizumab (AVASTIN IV) Inject 15 mg/kg into the vein every 21 ( twenty-one) days.  . chlorthalidone (HYGROTON) 25 MG tablet Take 1 tablet (25 mg total) by mouth daily.  . cyclobenzaprine (FLEXERIL) 10 MG tablet Take 1 tablet (10 mg total) by mouth 3 (three) times daily as needed for muscle spasms.  Marland Kitchen HYDROcodone-acetaminophen (NORCO/VICODIN) 5-325 MG tablet Take 1 tablet by mouth every 8 (eight) hours as needed for moderate pain.  Marland Kitchen omeprazole (PRILOSEC) 20 MG capsule Take 1 capsule (20 mg total) by mouth daily.  Marland Kitchen triamcinolone (KENALOG) 0.025 % ointment APPLY AS DIRECTED TWICE DAILY.  . valACYclovir (VALTREX) 1000 MG tablet TAKE ONE TABLET BY MOUTH TWICE DAILY  . [START ON 12/15/2018] dexamethasone (DECADRON) 4 MG tablet Take 10 tablets (40 mg total) by mouth daily for 3 days.   Facility-Administered Encounter Medications as of 12/14/2018  Medication Note  . [DISCONTINUED] dexamethasone (DECADRON) tablet 40 mg 12/14/2018: RN wanted as IVPB  . [DISCONTINUED] Immune Globulin 5% (OCTAGAM) IV infusion 95 g 12/14/2018: wrong brand change to privigen    ALLERGIES:  Allergies  Allergen Reactions  . Lisinopril Hives     PHYSICAL EXAM:  ECOG Performance status: 1  Vitals:   12/14/18 0924  BP: (!) 148/82  Pulse: (!) 109  Resp: 20  Temp: 97.7 F (36.5 C)  SpO2: 99%   Filed Weights   12/14/18 0811 12/14/18 0924  Weight: 210 lb (95.3 kg) 210 lb (95.3 kg)    Physical Exam Vitals signs reviewed.   Constitutional:      Appearance: Normal appearance.  Cardiovascular:     Rate and Rhythm: Normal rate and regular rhythm.     Heart sounds: Normal heart sounds.  Pulmonary:     Breath sounds: Normal breath sounds.  Abdominal:     General: There is no distension.     Palpations: Abdomen is soft. There is no mass.  Musculoskeletal:        General: No swelling.  Skin:    General: Skin is warm.  Findings: Lesion present.  Neurological:     General: No focal deficit present.     Mental Status: She is alert and oriented to person, place, and time.  Psychiatric:        Mood and Affect: Mood normal.        Behavior: Behavior normal.    Ulcerated tiny lesions up to 5 mm present on the anterior abdominal wall as well as upper back.  They are not bleeding.  LABORATORY DATA:  I have reviewed the labs as listed.  CBC    Component Value Date/Time   WBC 6.4 12/14/2018 0858   RBC 3.18 (L) 12/14/2018 0858   HGB 9.1 (L) 12/14/2018 0858   HGB 13.9 09/23/2018 1405   HCT 28.8 (L) 12/14/2018 0858   HCT 41.8 09/23/2018 1405   PLT 5 (LL) 12/14/2018 0858   PLT 238 09/23/2018 1405   MCV 90.6 12/14/2018 0858   MCV 92 09/23/2018 1405   MCH 28.6 12/14/2018 0858   MCHC 31.6 12/14/2018 0858   RDW 16.2 (H) 12/14/2018 0858   RDW 12.2 09/23/2018 1405   LYMPHSABS 1.0 12/14/2018 0858   LYMPHSABS 2.2 09/23/2018 1405   MONOABS 0.7 12/14/2018 0858   EOSABS 0.1 12/14/2018 0858   EOSABS 0.3 09/23/2018 1405   BASOSABS 0.0 12/14/2018 0858   BASOSABS 0.1 09/23/2018 1405   CMP Latest Ref Rng & Units 12/14/2018 11/18/2018 11/17/2018  Glucose 70 - 99 mg/dL 132(H) 95 -  BUN 6 - 20 mg/dL 13 9 -  Creatinine 0.44 - 1.00 mg/dL 0.75 0.66 0.60  Sodium 135 - 145 mmol/L 133(L) 134(L) -  Potassium 3.5 - 5.1 mmol/L 2.7(LL) 3.0(L) -  Chloride 98 - 111 mmol/L 100 99 -  CO2 22 - 32 mmol/L 22 23 -  Calcium 8.9 - 10.3 mg/dL 8.8(L) 9.1 -  Total Protein 6.5 - 8.1 g/dL 7.9 8.4(H) -  Total Bilirubin 0.3 - 1.2 mg/dL 2.2(H)  1.5(H) -  Alkaline Phos 38 - 126 U/L 62 83 -  AST 15 - 41 U/L 44(H) 45(H) -  ALT 0 - 44 U/L 17 21 -       DIAGNOSTIC IMAGING:  I have independently reviewed the scans and discussed with the patient.   I have reviewed Venita Lick LPN's note and agree with the documentation.  I personally performed a face-to-face visit, made revisions and my assessment and plan is as follows.    ASSESSMENT & PLAN:   Hepatocellular carcinoma ([place]) 1.  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 - An ultrasound of the abdomen on 10/11/2018 done at [facility] shows large mass in the right hepatic lobe measuring 13 x 7 x 10 cm. - Evaluated by Dr.Rehman with MRI on 11/01/2018 showing large hypervascular mass in the right lobe of the liver measuring 7.6 x 7.8 x 15.6 cm.  There are innumerable other smaller hypervascular lesions most compatible with satellite foci of [place].  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  No intra-/extrahepatic biliary ductal dilation.  Spleen is mildly enlarged at 14.3 cm.  Multiple prominent borderline enlarged and mildly enlarged upper abdominal  lymph nodes noted, largest in the hepatoduodenal ligament measuring 11 mm in short axis. - AFP was 2.5.  Child's class A with total points 5. - I discussed the results of the CT CAP dated 11/17/2018.  There are about 18 pulmonary nodules scattered randomly in both lungs, largest measuring 0.9 cm.  There is right hepatic lobe mass measuring 10.8 x 8.1 cm.  Other scattered hypoenhancing nodules are present in the liver mostly in the right hepatic lobe.   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  Bone scan was negative. 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  I have considered anticoagulation but was reluctant to do it because of her varices and potential bleed. -I have also called and talked to interventional radiology to see if any local interventions can be done for the tumor thrombus.  I was told there is none. - Based on IMbrave 150 study which showed a better six-month PFS rate of 55% and overall survival rate of 85% and better patient related quality of life outcomes, combination Atezolizumab and bevacizumab was recommended.  -Cycle 1 of Atezolizumab and bevacizumab on 11/23/2018. -She has developed small ulcerated lesions on her anterior abdominal wall and back.  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  She is on Valtrex prophylaxis.  We will consider sending culture from the lesions. - CBC today showed platelet count of 7.  This was 5 on rechecking.  This is highly unlikely from Atezolizumab or bevacizumab.  There is likely immune related platelet destruction.  We will check her LDH.  We will review her smear. -I have recommended holding her therapy today.  We will give her a trial of IVIG 1 g/kg x 2 days.  I will also give her dexamethasone 40 mg x 4 days.  We discussed side effects in detail. -We will recheck a CBC and LDH tomorrow.  She has severe hypokalemia with potassium of 2.7.  Will repleted.  2.  Right upper quadrant pain: - She has constant sharp pain in the right upper quadrant sometimes radiating to the right breast. -We have given refill for hydrocodone 5 mg every 6 hours as needed.  She is taking once a day which is helping.   3.  Family history: -Maternal grandmother and 2 maternal aunts had stomach cancer. -One maternal uncle had  prostate cancer.  4.  Hypertension: -He is on chlorthalidone 25 mg daily.  Total time spent is 40 minutes with more than 50% of the time spent face-to-face discussing new diagnosis, treatment plan, side effects and coordination of care.    Orders placed this encounter:  Orders Placed This Encounter  Procedures  . WOUND CULTURE  . Lactate dehydrogenase      Derek Jack, MD [place] [phone]

## 2018-12-15 ENCOUNTER — Other Ambulatory Visit (HOSPITAL_COMMUNITY): Payer: Self-pay | Admitting: Hematology

## 2018-12-15 ENCOUNTER — Inpatient Hospital Stay (HOSPITAL_COMMUNITY): Payer: Medicaid Other

## 2018-12-15 ENCOUNTER — Other Ambulatory Visit (HOSPITAL_COMMUNITY): Payer: Medicaid Other

## 2018-12-15 VITALS — BP 154/94 | HR 108 | Temp 97.8°F | Resp 18

## 2018-12-15 DIAGNOSIS — C220 Liver cell carcinoma: Secondary | ICD-10-CM

## 2018-12-15 DIAGNOSIS — D801 Nonfamilial hypogammaglobulinemia: Secondary | ICD-10-CM

## 2018-12-15 DIAGNOSIS — E876 Hypokalemia: Secondary | ICD-10-CM

## 2018-12-15 DIAGNOSIS — Z5112 Encounter for antineoplastic immunotherapy: Secondary | ICD-10-CM | POA: Diagnosis not present

## 2018-12-15 LAB — COMPREHENSIVE METABOLIC PANEL
ALT: 17 U/L (ref 0–44)
AST: 42 U/L — ABNORMAL HIGH (ref 15–41)
Albumin: 2.8 g/dL — ABNORMAL LOW (ref 3.5–5.0)
Alkaline Phosphatase: 55 U/L (ref 38–126)
Anion gap: 11 (ref 5–15)
BUN: 20 mg/dL (ref 6–20)
CO2: 19 mmol/L — ABNORMAL LOW (ref 22–32)
Calcium: 8.4 mg/dL — ABNORMAL LOW (ref 8.9–10.3)
Chloride: 101 mmol/L (ref 98–111)
Creatinine, Ser: 1 mg/dL (ref 0.44–1.00)
GFR calc Af Amer: >60 mL/min (ref >60)
GFR calc non Af Amer: >60 mL/min (ref >60)
Glucose, Bld: 240 mg/dL — ABNORMAL HIGH (ref 70–99)
Potassium: 3.3 mmol/L — ABNORMAL LOW (ref 3.5–5.1)
Sodium: 131 mmol/L — ABNORMAL LOW (ref 135–145)
Total Bilirubin: 1.6 mg/dL — ABNORMAL HIGH (ref 0.3–1.2)
Total Protein: 8.7 g/dL — ABNORMAL HIGH (ref 6.5–8.1)

## 2018-12-15 LAB — CBC WITH DIFFERENTIAL/PLATELET
Abs Immature Granulocytes: 0.02 10*3/uL (ref 0.00–0.07)
Basophils Absolute: 0 10*3/uL (ref 0.0–0.1)
Basophils Relative: 0 %
Eosinophils Absolute: 0.2 10*3/uL (ref 0.0–0.5)
Eosinophils Relative: 3 %
HCT: 26.3 % — ABNORMAL LOW (ref 36.0–46.0)
Hemoglobin: 8.1 g/dL — ABNORMAL LOW (ref 12.0–15.0)
Immature Granulocytes: 0 %
Lymphocytes Relative: 5 %
Lymphs Abs: 0.4 10*3/uL — ABNORMAL LOW (ref 0.7–4.0)
MCH: 28.1 pg (ref 26.0–34.0)
MCHC: 30.8 g/dL (ref 30.0–36.0)
MCV: 91.3 fL (ref 80.0–100.0)
Monocytes Absolute: 0.3 10*3/uL (ref 0.1–1.0)
Monocytes Relative: 4 %
Neutro Abs: 6.9 10*3/uL (ref 1.7–7.7)
Neutrophils Relative %: 88 %
Platelets: 7 10*3/uL — CL (ref 150–400)
RBC: 2.88 MIL/uL — ABNORMAL LOW (ref 3.87–5.11)
RDW: 16.1 % — ABNORMAL HIGH (ref 11.5–15.5)
WBC: 7.7 10*3/uL (ref 4.0–10.5)
nRBC: 0 % (ref 0.0–0.2)

## 2018-12-15 MED ORDER — POTASSIUM CHLORIDE 10 MEQ/100ML IV SOLN
10.0000 meq | Freq: Once | INTRAVENOUS | Status: AC
  Administered 2018-12-15: 09:00:00 10 meq via INTRAVENOUS
  Filled 2018-12-15: qty 100

## 2018-12-15 MED ORDER — SODIUM CHLORIDE 0.9 % IV SOLN
Freq: Once | INTRAVENOUS | Status: DC
Start: 2018-12-15 — End: 2018-12-15

## 2018-12-15 MED ORDER — DIPHENHYDRAMINE HCL 25 MG PO TABS
50.0000 mg | ORAL_TABLET | Freq: Once | ORAL | Status: AC
  Administered 2018-12-15: 50 mg via ORAL
  Filled 2018-12-15 (×2): qty 2

## 2018-12-15 MED ORDER — HYDROCODONE-ACETAMINOPHEN 5-325 MG PO TABS: 1.0000 | ORAL_TABLET | Freq: Three times a day (TID) | ORAL | 0 refills | Status: DC | PRN

## 2018-12-15 MED ORDER — IMMUNE GLOBULIN (HUMAN) 5 GM/50ML IV SOLN
1.0000 g/kg | Freq: Once | INTRAVENOUS | Status: AC
  Administered 2018-12-15: 10:00:00 95 g via INTRAVENOUS
  Filled 2018-12-15: qty 250

## 2018-12-15 MED ORDER — ACETAMINOPHEN 325 MG PO TABS
650.0000 mg | ORAL_TABLET | Freq: Once | ORAL | Status: AC
  Administered 2018-12-15: 09:00:00 650 mg via ORAL
  Filled 2018-12-15: qty 2

## 2018-12-15 MED ORDER — HYDROCODONE-ACETAMINOPHEN 5-325 MG PO TABS
1.0000 | ORAL_TABLET | Freq: Once | ORAL | Status: AC
  Administered 2018-12-15: 1 via ORAL
  Filled 2018-12-15: qty 1

## 2018-12-15 MED ORDER — HEPARIN SOD (PORK) LOCK FLUSH 100 UNIT/ML IV SOLN
500.0000 [IU] | Freq: Once | INTRAVENOUS | Status: AC
  Administered 2018-12-15: 500 [IU] via INTRAVENOUS

## 2018-12-15 MED ORDER — DEXTROSE 5 % IV SOLN
INTRAVENOUS | Status: DC
  Administered 2018-12-15: 08:00:00 via INTRAVENOUS

## 2018-12-15 MED ORDER — SODIUM CHLORIDE 0.9 % IV SOLN
40.0000 mg | Freq: Once | INTRAVENOUS | Status: AC
  Administered 2018-12-15: 09:00:00 40 mg via INTRAVENOUS
  Filled 2018-12-15: qty 4

## 2018-12-15 NOTE — Progress Notes (Signed)
CRITICAL VALUE ALERT Critical value received:  Platelets 7 Date of notification:  12/15/18 Time of notification: 0915 Critical value read back:  Yes.   Nurse who received alert:  TWJackson RN MD notified (1st page):  Delton Coombes

## 2018-12-15 NOTE — Patient Instructions (Signed)
[facility] Discharge Instructions for Patients Receiving Chemotherapy  Today you received the following chemotherapy agents   To help prevent nausea and vomiting after your treatment, we encourage you to take your nausea medication   If you develop nausea and vomiting that is not controlled by your nausea medication, call the clinic.   BELOW ARE SYMPTOMS THAT SHOULD BE REPORTED IMMEDIATELY:  *FEVER GREATER THAN 100.5 F  *CHILLS WITH OR WITHOUT FEVER  NAUSEA AND VOMITING THAT IS NOT CONTROLLED WITH YOUR NAUSEA MEDICATION  *UNUSUAL SHORTNESS OF BREATH  *UNUSUAL BRUISING OR BLEEDING  TENDERNESS IN MOUTH AND THROAT WITH OR WITHOUT PRESENCE OF ULCERS  *URINARY PROBLEMS  *BOWEL PROBLEMS  UNUSUAL RASH Items with * indicate a potential emergency and should be followed up as soon as possible.  Feel free to call the clinic should you have any questions or concerns. The clinic phone number is [phone].  Please show the CHEMO ALERT CARD at check-in to the Emergency Department and triage nurse.   

## 2018-12-15 NOTE — Progress Notes (Signed)
Pt presents today for IVIG and K+. VSS. BP elevated slightly. Platelets 7. Dr. Delton Coombes aware. Message sent to [facility] LPN. MAR reviewed.    Called and spoke with Dr. Delton Coombes pertaining to pt's pain medication refill. Per MD, " I will send it."    Treatment given today per MD orders. Tolerated infusion without adverse affects. Vital signs stable. No complaints at this time. Pt instructed to take the Norvasc as prescribed per Dr. Delton Coombes.  Discharged from clinic ambulatory. F/U with [facility] as scheduled.

## 2018-12-16 ENCOUNTER — Other Ambulatory Visit: Payer: Self-pay

## 2018-12-16 ENCOUNTER — Inpatient Hospital Stay (HOSPITAL_COMMUNITY): Payer: Medicaid Other | Attending: Hematology

## 2018-12-16 DIAGNOSIS — Z8042 Family history of malignant neoplasm of prostate: Secondary | ICD-10-CM | POA: Insufficient documentation

## 2018-12-16 DIAGNOSIS — Z5112 Encounter for antineoplastic immunotherapy: Secondary | ICD-10-CM | POA: Insufficient documentation

## 2018-12-16 DIAGNOSIS — R1011 Right upper quadrant pain: Secondary | ICD-10-CM | POA: Insufficient documentation

## 2018-12-16 DIAGNOSIS — C7800 Secondary malignant neoplasm of unspecified lung: Secondary | ICD-10-CM | POA: Insufficient documentation

## 2018-12-16 DIAGNOSIS — C220 Liver cell carcinoma: Secondary | ICD-10-CM | POA: Insufficient documentation

## 2018-12-16 DIAGNOSIS — R21 Rash and other nonspecific skin eruption: Secondary | ICD-10-CM | POA: Insufficient documentation

## 2018-12-16 DIAGNOSIS — Z9221 Personal history of antineoplastic chemotherapy: Secondary | ICD-10-CM | POA: Insufficient documentation

## 2018-12-16 DIAGNOSIS — I10 Essential (primary) hypertension: Secondary | ICD-10-CM | POA: Diagnosis not present

## 2018-12-16 DIAGNOSIS — Z87891 Personal history of nicotine dependence: Secondary | ICD-10-CM | POA: Insufficient documentation

## 2018-12-16 DIAGNOSIS — Z79899 Other long term (current) drug therapy: Secondary | ICD-10-CM | POA: Insufficient documentation

## 2018-12-16 DIAGNOSIS — R161 Splenomegaly, not elsewhere classified: Secondary | ICD-10-CM | POA: Diagnosis not present

## 2018-12-16 DIAGNOSIS — F329 Major depressive disorder, single episode, unspecified: Secondary | ICD-10-CM | POA: Diagnosis not present

## 2018-12-16 DIAGNOSIS — Z800 Family history of malignant neoplasm of digestive organs: Secondary | ICD-10-CM | POA: Diagnosis not present

## 2018-12-16 DIAGNOSIS — R609 Edema, unspecified: Secondary | ICD-10-CM | POA: Diagnosis not present

## 2018-12-16 DIAGNOSIS — B1920 Unspecified viral hepatitis C without hepatic coma: Secondary | ICD-10-CM | POA: Diagnosis not present

## 2018-12-16 DIAGNOSIS — M7989 Other specified soft tissue disorders: Secondary | ICD-10-CM | POA: Insufficient documentation

## 2018-12-16 LAB — CBC WITH DIFFERENTIAL/PLATELET
Abs Immature Granulocytes: 0.08 10*3/uL — ABNORMAL HIGH (ref 0.00–0.07)
Basophils Absolute: 0 10*3/uL (ref 0.0–0.1)
Basophils Relative: 0 %
Eosinophils Absolute: 0 10*3/uL (ref 0.0–0.5)
Eosinophils Relative: 0 %
HCT: 27.4 % — ABNORMAL LOW (ref 36.0–46.0)
Hemoglobin: 8.3 g/dL — ABNORMAL LOW (ref 12.0–15.0)
Immature Granulocytes: 1 %
Lymphocytes Relative: 4 %
Lymphs Abs: 0.5 10*3/uL — ABNORMAL LOW (ref 0.7–4.0)
MCH: 27.8 pg (ref 26.0–34.0)
MCHC: 30.3 g/dL (ref 30.0–36.0)
MCV: 91.6 fL (ref 80.0–100.0)
Monocytes Absolute: 0.4 10*3/uL (ref 0.1–1.0)
Monocytes Relative: 3 %
Neutro Abs: 11 10*3/uL — ABNORMAL HIGH (ref 1.7–7.7)
Neutrophils Relative %: 92 %
Platelets: 24 10*3/uL — CL (ref 150–400)
RBC: 2.99 MIL/uL — ABNORMAL LOW (ref 3.87–5.11)
RDW: 16.7 % — ABNORMAL HIGH (ref 11.5–15.5)
WBC: 11.9 10*3/uL — ABNORMAL HIGH (ref 4.0–10.5)
nRBC: 0 % (ref 0.0–0.2)

## 2018-12-16 LAB — COMPREHENSIVE METABOLIC PANEL
ALT: 21 U/L (ref 0–44)
AST: 45 U/L — ABNORMAL HIGH (ref 15–41)
Albumin: 2.7 g/dL — ABNORMAL LOW (ref 3.5–5.0)
Alkaline Phosphatase: 55 U/L (ref 38–126)
Anion gap: 12 (ref 5–15)
BUN: 29 mg/dL — ABNORMAL HIGH (ref 6–20)
CO2: 17 mmol/L — ABNORMAL LOW (ref 22–32)
Calcium: 8.5 mg/dL — ABNORMAL LOW (ref 8.9–10.3)
Chloride: 104 mmol/L (ref 98–111)
Creatinine, Ser: 1.18 mg/dL — ABNORMAL HIGH (ref 0.44–1.00)
GFR calc Af Amer: >60 mL/min (ref >60)
GFR calc non Af Amer: 52 mL/min — ABNORMAL LOW (ref >60)
Glucose, Bld: 173 mg/dL — ABNORMAL HIGH (ref 70–99)
Potassium: 3.5 mmol/L (ref 3.5–5.1)
Sodium: 133 mmol/L — ABNORMAL LOW (ref 135–145)
Total Bilirubin: 1.3 mg/dL — ABNORMAL HIGH (ref 0.3–1.2)
Total Protein: 9.8 g/dL — ABNORMAL HIGH (ref 6.5–8.1)

## 2018-12-16 LAB — TSH: TSH: 1.742 u[IU]/mL (ref 0.350–4.500)

## 2018-12-17 LAB — T4: T4, Total: 7.4 ug/dL (ref 4.5–12.0)

## 2018-12-19 ENCOUNTER — Ambulatory Visit (HOSPITAL_COMMUNITY): Payer: Medicaid Other | Admitting: Nurse Practitioner

## 2018-12-19 ENCOUNTER — Ambulatory Visit (HOSPITAL_COMMUNITY): Payer: Medicaid Other

## 2018-12-19 NOTE — Assessment & Plan Note (Deleted)
1.  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  She also has a history of heavy alcohol use for the past 15 years. - She had an ultrasound of the abdomen on 10/11/2018 done at [facility] showed a large mass in his right hepatic lobe measuring 13 x 7 x 10 cm. - Dr. Laural Golden ordered an MRI on 11/01/2018 showing a large hypervascular mass in the right lobe of the liver measuring 7.6 x 7.8 x 15.6 cm.  There are innumerable other small hypervascular lesions most compatible with satellite foci of HCC. - AFP was 2.5. -She had a CT CAP on 11/17/2018 that showed 18 pulmonary nodules scattered randomly in both lungs, largest measuring 0.9 cm.  There is a right hepatic lobe mass measuring 10.8 x 8.1 cm.  Also other scattered hypoenhancing nodules are present in the liver mostly in the right hepatic lobe.  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 -Her bone scan was negative. - The presence of tumor thrombus in the hepatic vein extending to the IVC is a poor prognostic indicator.  Anticoagulation consideration was reluctant due to to her varices and potential bleed. - There was no interventional radiology procedures that can be done for thrombus. - Cycle 1 of Atezolizumab and bevacizumab was started on 11/23/2018. -She developed small ulcerated lesions on her anterior abdominal wall and back.  She reported she had a history of herpes breakout a few years ago.  She is on Valtrex prophylaxis. - Labs on 12/16/2018 showed her platelet count of 24 which was up from 7.  Potassium today was 3.5 which is up from 2.7. - She was given a trial of IVIG 1 g/kg for 2 days.  She was also given dexamethasone 40 mg for 4 days. -  2.  Right upper quadrant pain: -She has constant sharp pain in the right  upper quadrant sometimes radiating to the right breast. -She is on hydrocodone 5 mg every 6 hours as needed. -This is helping her pain at this time.  3.  Hypertension: -She is on chlorthalidone 25 mg daily.

## 2018-12-23 ENCOUNTER — Inpatient Hospital Stay (HOSPITAL_BASED_OUTPATIENT_CLINIC_OR_DEPARTMENT_OTHER): Payer: Medicaid Other | Admitting: Hematology

## 2018-12-23 ENCOUNTER — Encounter: Payer: Self-pay | Admitting: General Practice

## 2018-12-23 ENCOUNTER — Inpatient Hospital Stay (HOSPITAL_COMMUNITY): Payer: Medicaid Other

## 2018-12-23 ENCOUNTER — Encounter (HOSPITAL_COMMUNITY): Payer: Self-pay | Admitting: Hematology

## 2018-12-23 ENCOUNTER — Telehealth: Payer: Self-pay | Admitting: Family Medicine

## 2018-12-23 ENCOUNTER — Other Ambulatory Visit: Payer: Self-pay

## 2018-12-23 ENCOUNTER — Encounter (HOSPITAL_COMMUNITY): Payer: Self-pay

## 2018-12-23 VITALS — BP 140/74 | HR 108 | Temp 97.6°F | Resp 18 | Wt 212.8 lb

## 2018-12-23 DIAGNOSIS — Z79899 Other long term (current) drug therapy: Secondary | ICD-10-CM

## 2018-12-23 DIAGNOSIS — R1011 Right upper quadrant pain: Secondary | ICD-10-CM

## 2018-12-23 DIAGNOSIS — Z87891 Personal history of nicotine dependence: Secondary | ICD-10-CM

## 2018-12-23 DIAGNOSIS — M7989 Other specified soft tissue disorders: Secondary | ICD-10-CM

## 2018-12-23 DIAGNOSIS — C220 Liver cell carcinoma: Secondary | ICD-10-CM

## 2018-12-23 DIAGNOSIS — D693 Immune thrombocytopenic purpura: Secondary | ICD-10-CM

## 2018-12-23 DIAGNOSIS — I10 Essential (primary) hypertension: Secondary | ICD-10-CM

## 2018-12-23 DIAGNOSIS — B1920 Unspecified viral hepatitis C without hepatic coma: Secondary | ICD-10-CM

## 2018-12-23 DIAGNOSIS — Z800 Family history of malignant neoplasm of digestive organs: Secondary | ICD-10-CM

## 2018-12-23 DIAGNOSIS — Z5112 Encounter for antineoplastic immunotherapy: Secondary | ICD-10-CM | POA: Diagnosis not present

## 2018-12-23 DIAGNOSIS — Z8042 Family history of malignant neoplasm of prostate: Secondary | ICD-10-CM

## 2018-12-23 DIAGNOSIS — F329 Major depressive disorder, single episode, unspecified: Secondary | ICD-10-CM | POA: Diagnosis not present

## 2018-12-23 DIAGNOSIS — D801 Nonfamilial hypogammaglobulinemia: Secondary | ICD-10-CM

## 2018-12-23 DIAGNOSIS — Z9221 Personal history of antineoplastic chemotherapy: Secondary | ICD-10-CM

## 2018-12-23 LAB — CBC WITH DIFFERENTIAL/PLATELET
Abs Immature Granulocytes: 0.11 10*3/uL — ABNORMAL HIGH (ref 0.00–0.07)
Basophils Absolute: 0 10*3/uL (ref 0.0–0.1)
Basophils Relative: 0 %
Eosinophils Absolute: 0 10*3/uL (ref 0.0–0.5)
Eosinophils Relative: 0 %
HCT: 30.7 % — ABNORMAL LOW (ref 36.0–46.0)
Hemoglobin: 9.2 g/dL — ABNORMAL LOW (ref 12.0–15.0)
Immature Granulocytes: 1 %
Lymphocytes Relative: 7 %
Lymphs Abs: 1 10*3/uL (ref 0.7–4.0)
MCH: 27.5 pg (ref 26.0–34.0)
MCHC: 30 g/dL (ref 30.0–36.0)
MCV: 91.9 fL (ref 80.0–100.0)
Monocytes Absolute: 0.9 10*3/uL (ref 0.1–1.0)
Monocytes Relative: 6 %
Neutro Abs: 11.8 10*3/uL — ABNORMAL HIGH (ref 1.7–7.7)
Neutrophils Relative %: 86 %
Platelets: 29 10*3/uL — CL (ref 150–400)
RBC: 3.34 MIL/uL — ABNORMAL LOW (ref 3.87–5.11)
RDW: 17.2 % — ABNORMAL HIGH (ref 11.5–15.5)
WBC: 13.8 10*3/uL — ABNORMAL HIGH (ref 4.0–10.5)
nRBC: 0 % (ref 0.0–0.2)

## 2018-12-23 LAB — COMPREHENSIVE METABOLIC PANEL
ALT: 29 U/L (ref 0–44)
AST: 44 U/L — ABNORMAL HIGH (ref 15–41)
Albumin: 2.9 g/dL — ABNORMAL LOW (ref 3.5–5.0)
Alkaline Phosphatase: 62 U/L (ref 38–126)
Anion gap: 11 (ref 5–15)
BUN: 27 mg/dL — ABNORMAL HIGH (ref 6–20)
CO2: 22 mmol/L (ref 22–32)
Calcium: 8.6 mg/dL — ABNORMAL LOW (ref 8.9–10.3)
Chloride: 104 mmol/L (ref 98–111)
Creatinine, Ser: 0.85 mg/dL (ref 0.44–1.00)
GFR calc Af Amer: >60 mL/min (ref >60)
GFR calc non Af Amer: >60 mL/min (ref >60)
Glucose, Bld: 217 mg/dL — ABNORMAL HIGH (ref 70–99)
Potassium: 3.4 mmol/L — ABNORMAL LOW (ref 3.5–5.1)
Sodium: 137 mmol/L (ref 135–145)
Total Bilirubin: 1.4 mg/dL — ABNORMAL HIGH (ref 0.3–1.2)
Total Protein: 8.4 g/dL — ABNORMAL HIGH (ref 6.5–8.1)

## 2018-12-23 LAB — LACTATE DEHYDROGENASE: LDH: 305 U/L — ABNORMAL HIGH (ref 98–192)

## 2018-12-23 MED ORDER — FUROSEMIDE 20 MG PO TABS: 20.0000 mg | ORAL_TABLET | Freq: Every day | ORAL | 0 refills | Status: AC | PRN

## 2018-12-23 MED ORDER — HEPARIN SOD (PORK) LOCK FLUSH 100 UNIT/ML IV SOLN
500.0000 [IU] | Freq: Once | INTRAVENOUS | Status: AC
  Administered 2018-12-23: 500 [IU] via INTRAVENOUS

## 2018-12-23 MED ORDER — SODIUM CHLORIDE 0.9% FLUSH
10.0000 mL | Freq: Once | INTRAVENOUS | Status: AC
  Administered 2018-12-23: 10 mL via INTRAVENOUS

## 2018-12-23 NOTE — Progress Notes (Signed)
Alicia Harmon, [place] [address]   CLINIC:  Medical Oncology/Hematology  PCP:  Dettinger, Fransisca Kaufmann, MD [place] [id number] [phone]   REASON FOR VISIT:  Follow-up for Hepatocellular cancer   BRIEF ONCOLOGIC HISTORY:    Hepatocellular carcinoma ([place])   11/15/2018 Initial Diagnosis    Hepatocellular carcinoma ([place])    11/23/2018 -  Chemotherapy    The patient had bevacizumab (AVASTIN) 1,400 mg in sodium chloride 0.9 % 100 mL chemo infusion, 15.2 mg/kg = 1,375 mg, Intravenous,  Once, 1 of 6 cycles Administration: 1,400 mg (11/23/2018) atezolizumab (TECENTRIQ) 1,200 mg in sodium chloride 0.9 % 250 mL chemo infusion, 1,200 mg, Intravenous, Once, 1 of 6 cycles Administration: 1,200 mg (11/23/2018)  for chemotherapy treatment.       CANCER STAGING: Cancer Staging No matching staging information was found for the patient.   INTERVAL HISTORY:  Alicia Harmon 58 y.o. female returns for routine follow-up and consideration for next cycle of chemotherapy. She is here today alone. She states that she has had leg, ankle and foot swelling since her last visit. She states that she has noticed shortness of breath at times. She states that she continues to have sores and itching all over her body. Denies any nausea, vomiting, or diarrhea. Denies any new pains. Had not noticed any recent bleeding such as epistaxis, hematuria or hematochezia. Denies recent chest pain on exertion, pre-syncopal episodes, or palpitations. Denies any numbness or tingling in hands or feet. Denies any recent fevers, infections, or recent hospitalizations. Patient reports appetite at 50% and energy level at 50%.     REVIEW OF SYSTEMS:  Review of Systems  Cardiovascular: Positive for leg swelling.  Hematological: Bruises/bleeds easily.     PAST MEDICAL/SURGICAL HISTORY:  Past Medical History:  Diagnosis Date   Allergy    Anxiety    Arthritis    Blood  transfusion without reported diagnosis    Cancer ([place]) 1994   pre-cervical   Depression    GERD (gastroesophageal reflux disease)    Hepatitis C    Hypertension    Substance abuse ([place])    past cocaine user; smokes marijuana now   Past Surgical History:  Procedure Laterality Date   CHOLECYSTECTOMY     PORTACATH PLACEMENT Left 11/21/2018   Procedure: INSERTION PORT-A-CATH (attached catheter left subclavian);  Surgeon: Aviva Signs, MD;  Location: [facility];  Service: General;  Laterality: Left;     SOCIAL HISTORY:  Social History   Socioeconomic History   Marital status: Single    Spouse name: Not on file   Number of children: 5   Years of education: Not on file   Highest education level: Not on file  Occupational History   Occupation: SSI  Social Needs   Financial resource strain: Very hard   Food insecurity:    Worry: Sometimes true    Inability: Sometimes true   Transportation needs:    Medical: No    Non-medical: No  Tobacco Use   Smoking status: Former Smoker    Packs/day: 0.25    Years: 40.00    Pack years: 10.00   Smokeless tobacco: Never Used  Substance and Sexual Activity   Alcohol use: Not Currently    Alcohol/week: 3.0 standard drinks    Types: 3 Cans of beer per week    Comment: 6 pack on the weekend sometimes. No etoh during the weeks.    Drug use: Yes  Types: Marijuana    Comment: 1 every other day   Sexual activity: Never    Birth control/protection: Condom  Lifestyle   Physical activity:    Days per week: Not on file    Minutes per session: Not on file   Stress: Not on file  Relationships   Social connections:    Talks on phone: Not on file    Gets together: Not on file    Attends religious service: Not on file    Active member of club or organization: Not on file    Attends meetings of clubs or organizations: Not on file    Relationship status: Not on file   Intimate partner violence:    Fear of current or ex  partner: Not on file    Emotionally abused: Not on file    Physically abused: Not on file    Forced sexual activity: Not on file  Other Topics Concern   Not on file  Social History Narrative   Not on file    FAMILY HISTORY:  Family History  Problem Relation Age of Onset   Diabetes Mother    Heart disease Mother    Mental illness Sister    Mental illness Brother    Mental illness Brother    Alcohol abuse Brother    Alcohol abuse Brother     CURRENT MEDICATIONS:  Outpatient Encounter Medications as of 12/23/2018  Medication Sig   Atezolizumab (TECENTRIQ IV) Inject 1,200 mg into the vein every 21 ( twenty-one) days.   Bevacizumab (AVASTIN IV) Inject 15 mg/kg into the vein every 21 ( twenty-one) days.   chlorthalidone (HYGROTON) 25 MG tablet Take 1 tablet (25 mg total) by mouth daily.   cyclobenzaprine (FLEXERIL) 10 MG tablet Take 1 tablet (10 mg total) by mouth 3 (three) times daily as needed for muscle spasms.   furosemide (LASIX) 20 MG tablet Take 1 tablet (20 mg total) by mouth daily as needed.   HYDROcodone-acetaminophen (NORCO/VICODIN) 5-325 MG tablet Take 1 tablet by mouth every 8 (eight) hours as needed for moderate pain.   omeprazole (PRILOSEC) 20 MG capsule Take 1 capsule (20 mg total) by mouth daily.   triamcinolone (KENALOG) 0.025 % ointment APPLY AS DIRECTED TWICE DAILY.   valACYclovir (VALTREX) 1000 MG tablet TAKE ONE TABLET BY MOUTH TWICE DAILY   No facility-administered encounter medications on file as of 12/23/2018.     ALLERGIES:  Allergies  Allergen Reactions   Lisinopril Hives     PHYSICAL EXAM:  ECOG Performance status: 1 I reviewed her blood pressure.  140/74.  Pulse rate is 108.  Respirate is 18.  Temperature 98.  Saturations are 99%.  Physical Exam Vitals signs reviewed.  Constitutional:      Appearance: Normal appearance.  Cardiovascular:     Rate and Rhythm: Normal rate and regular rhythm.     Heart sounds: Normal heart  sounds.  Pulmonary:     Effort: Pulmonary effort is normal.     Breath sounds: Normal breath sounds.  Abdominal:     General: There is no distension.     Palpations: Abdomen is soft. There is no mass.  Musculoskeletal:        General: No swelling.  Skin:    General: Skin is warm.  Neurological:     General: No focal deficit present.     Mental Status: She is alert and oriented to person, place, and time.  Psychiatric:        Mood and  Affect: Mood normal.        Behavior: Behavior normal.      LABORATORY DATA:  I have reviewed the labs as listed.  CBC    Component Value Date/Time   WBC 13.8 (H) 12/23/2018 0757   RBC 3.34 (L) 12/23/2018 0757   HGB 9.2 (L) 12/23/2018 0757   HGB 13.9 09/23/2018 1405   HCT 30.7 (L) 12/23/2018 0757   HCT 41.8 09/23/2018 1405   PLT 29 (LL) 12/23/2018 0757   PLT 238 09/23/2018 1405   MCV 91.9 12/23/2018 0757   MCV 92 09/23/2018 1405   MCH 27.5 12/23/2018 0757   MCHC 30.0 12/23/2018 0757   RDW 17.2 (H) 12/23/2018 0757   RDW 12.2 09/23/2018 1405   LYMPHSABS 1.0 12/23/2018 0757   LYMPHSABS 2.2 09/23/2018 1405   MONOABS 0.9 12/23/2018 0757   EOSABS 0.0 12/23/2018 0757   EOSABS 0.3 09/23/2018 1405   BASOSABS 0.0 12/23/2018 0757   BASOSABS 0.1 09/23/2018 1405   CMP Latest Ref Rng & Units 12/23/2018 12/16/2018 12/15/2018  Glucose 70 - 99 mg/dL 217(H) 173(H) 240(H)  BUN 6 - 20 mg/dL 27(H) 29(H) 20  Creatinine 0.44 - 1.00 mg/dL 0.85 1.18(H) 1.00  Sodium 135 - 145 mmol/L 137 133(L) 131(L)  Potassium 3.5 - 5.1 mmol/L 3.4(L) 3.5 3.3(L)  Chloride 98 - 111 mmol/L 104 104 101  CO2 22 - 32 mmol/L 22 17(L) 19(L)  Calcium 8.9 - 10.3 mg/dL 8.6(L) 8.5(L) 8.4(L)  Total Protein 6.5 - 8.1 g/dL 8.4(H) 9.8(H) 8.7(H)  Total Bilirubin 0.3 - 1.2 mg/dL 1.4(H) 1.3(H) 1.6(H)  Alkaline Phos 38 - 126 U/L 62 55 55  AST 15 - 41 U/L 44(H) 45(H) 42(H)  ALT 0 - 44 U/L 29 21 17        DIAGNOSTIC IMAGING:  I have independently reviewed the scans and discussed with the  patient.   I have reviewed Venita Lick LPN's note and agree with the documentation.  I personally performed a face-to-face visit, made revisions and my assessment and plan is as follows.    ASSESSMENT & PLAN:   Hepatocellular carcinoma ([place]) 1.  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 - An ultrasound of the abdomen on 10/11/2018 done at [facility] shows large mass in the right hepatic lobe measuring 13 x 7 x 10 cm. - Evaluated by Dr.Rehman with MRI on 11/01/2018 showing large hypervascular mass in the right lobe of the liver measuring 7.6 x 7.8 x 15.6 cm.  There are innumerable other smaller hypervascular lesions most compatible with satellite foci of [place].  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  No intra-/extrahepatic biliary ductal dilation.  Spleen is mildly enlarged at 14.3 cm.  Multiple prominent borderline enlarged and mildly enlarged upper abdominal lymph nodes noted, largest in the hepatoduodenal ligament measuring 11 mm in short axis. - AFP was 2.5.  Child's class A with total points 5. - I discussed the results of the CT CAP dated 11/17/2018.  There are about 18 pulmonary nodules scattered randomly in both lungs, largest measuring 0.9 cm.  There is right hepatic lobe mass measuring 10.8  x 8.1 cm.  Other scattered hypoenhancing nodules are present in the liver mostly in the right hepatic lobe.  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  Bone scan was negative. - I have explained her that the presence of tumor thrombus  in the hepatic vein extending into the IVC is a poor prognostic indicator.  I have considered anticoagulation but was reluctant to do it because of her varices and potential bleed. -I have also called and talked to interventional radiology to see if any local interventions can be done for the tumor thrombus.  I was told there is none. - Based on IMbrave 150 study which showed a better six-month PFS rate of 55% and overall survival rate of 85% and better patient related quality of life outcomes, combination Atezolizumab and bevacizumab was recommended.  -Cycle 1 of Atezolizumab and bevacizumab on 11/23/2018. -Platelet count dropped to 5 on 12/14/2018.  She also developed small ulcerated lesions on her anterior abdominal wall and back. -She received IVIG 1 g/kg on 12/14/2018 and 12/15/2018.  She also received dexamethasone 40 mg x 4 days. -LDH is elevated at 305.  Platelet count improved to 29 today. -I will review her smear.  She has developed lower extremity swelling.  We will start her on Lasix 20 mg daily as needed. - We will hold her treatment today until platelet count comes back up to at least above 50 K. -We will see her back in 1 week for follow-up.  2.  Right upper quadrant pain: -She is taking hydrocodone 5 mg every 6 hours as needed.  3.  Family history: -Maternal grandmother and 2 maternal aunts had stomach cancer. -One maternal uncle had prostate cancer.  4.  Hypertension: -She is on chlorthalidone 25 mg daily.   Time spent is 25 minutes with more than 50% of the time spent face-to-face discussing treatment related side effects and coordination of care.    Orders placed this encounter:  Orders Placed This Encounter  Procedures   CBC with Differential/Platelet   Comprehensive metabolic panel   Lactate dehydrogenase   Reticulocytes      Derek Jack, MD [place] [phone]

## 2018-12-23 NOTE — Assessment & Plan Note (Signed)
1.  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 - An ultrasound of the abdomen on 10/11/2018 done at [facility] shows large mass in the right hepatic lobe measuring 13 x 7 x 10 cm. - Evaluated by Dr.Rehman with MRI on 11/01/2018 showing large hypervascular mass in the right lobe of the liver measuring 7.6 x 7.8 x 15.6 cm.  There are innumerable other smaller hypervascular lesions most compatible with satellite foci of [place].  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  No intra-/extrahepatic biliary ductal dilation.  Spleen is mildly enlarged at 14.3 cm.  Multiple prominent borderline enlarged and mildly enlarged upper abdominal lymph nodes noted, largest in the hepatoduodenal ligament measuring 11 mm in short axis. - AFP was 2.5.  Child's class A with total points 5. - I discussed the results of the CT CAP dated 11/17/2018.  There are about 18 pulmonary nodules scattered randomly in both lungs, largest measuring 0.9 cm.  There is right hepatic lobe mass measuring 10.8 x 8.1 cm.  Other scattered hypoenhancing nodules are present in the liver mostly in the right hepatic lobe.  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  Bone scan was negative. 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  I have considered anticoagulation but was reluctant to do it because of her varices and potential bleed. -I have also called and talked to interventional radiology to see if any  local interventions can be done for the tumor thrombus.  I was told there is none. - Based on IMbrave 150 study which showed a better six-month PFS rate of 55% and overall survival rate of 85% and better patient related quality of life outcomes, combination Atezolizumab and bevacizumab was recommended.  -Cycle 1 of Atezolizumab and bevacizumab on 11/23/2018. -Platelet count dropped to 5 on 12/14/2018.  She also developed small ulcerated lesions on her anterior abdominal wall and back. -She received IVIG 1 g/kg on 12/14/2018 and 12/15/2018.  She also received dexamethasone 40 mg x 4 days. -LDH is elevated at 305.  Platelet count improved to 29 today. -I will review her smear.  She has developed lower extremity swelling.  We will start her on Lasix 20 mg daily as needed. - We will hold her treatment today until platelet count comes back up to at least above 50 K. -We will see her back in 1 week for follow-up.  2.  Right upper quadrant pain: -She is taking hydrocodone 5 mg every 6 hours as needed.  3.  Family history: -Maternal grandmother and 2 maternal aunts had stomach cancer. -One maternal uncle had prostate cancer.  4.  Hypertension: -She is on chlorthalidone 25 mg daily.

## 2018-12-23 NOTE — Progress Notes (Signed)
[facility] CSW Progress Notes  Patient reports food insecurity, would like to enroll in [facility] food box program, agreeable to program contacting him to set up delivery.  Anne Cunningham, LCSW Clinical Social Worker Phone:  [phone]  

## 2018-12-23 NOTE — Patient Instructions (Addendum)
[place] at [facility] Discharge Instructions  You were seen today by Dr. Delton Coombes. He went over your recent lab results. He will see you back on  Monday for labs and follow up.  He will send in a new prescriptions for Lasix take this in the morning.   Thank you for choosing [place] at [facility] to provide your oncology and hematology care.  To afford each patient quality time with our provider, please arrive at least 15 minutes before your scheduled appointment time.   If you have a lab appointment with the [facility] please come in thru the  Main Entrance and check in at the main information desk  You need to re-schedule your appointment should you arrive 10 or more minutes late.  We strive to give you quality time with our providers, and arriving late affects you and other patients whose appointments are after yours.  Also, if you no show three or more times for appointments you may be dismissed from the clinic at the providers discretion.     Again, thank you for choosing [facility].  Our hope is that these requests will decrease the amount of time that you wait before being seen by our physicians.       _____________________________________________________________  Should you have questions after your visit to [facility], please contact our office at [phone] between the hours of 8:00 a.m. and 4:30 p.m.  Voicemails left after 4:00 p.m. will not be returned until the following business day.  For prescription refill requests, have your pharmacy contact our office and allow 72 hours.    Cancer Center Support Programs:   > Cancer Support Group  2nd Tuesday of the month 1pm-2pm, Journey Room

## 2018-12-23 NOTE — Progress Notes (Signed)
CRITICAL VALUE STICKER  CRITICAL VALUE: Platelets 29  RECEIVER (on-site recipient of call): Kaz Auld, rn  DATE & TIME NOTIFIED:  12/23/2018  MESSENGER (representative from lab): Hillary flynt lab tech  MD NOTIFIED: Delton Coombes  TIME OF NOTIFICATION:0823  RESPONSE: Oncology follow up visit before treatment.    Patient to treatment area with noted lower, bilateral extremity pitting edema.  Also, complaints of increasing SOB with walking.  Abdominal discomfort.  Reviewed with the oncologist and scheduled for a follow up appointment today.   Patient to be held today and rescheduled for next week with labs and oncology follow up visit.

## 2018-12-26 ENCOUNTER — Inpatient Hospital Stay (HOSPITAL_BASED_OUTPATIENT_CLINIC_OR_DEPARTMENT_OTHER): Payer: Medicaid Other | Admitting: Hematology

## 2018-12-26 ENCOUNTER — Other Ambulatory Visit: Payer: Self-pay

## 2018-12-26 ENCOUNTER — Inpatient Hospital Stay (HOSPITAL_COMMUNITY): Payer: Medicaid Other

## 2018-12-26 ENCOUNTER — Encounter (HOSPITAL_COMMUNITY): Payer: Self-pay | Admitting: Hematology

## 2018-12-26 ENCOUNTER — Other Ambulatory Visit (HOSPITAL_COMMUNITY): Payer: Self-pay | Admitting: *Deleted

## 2018-12-26 VITALS — BP 159/98 | HR 115 | Temp 97.7°F | Resp 16 | Wt 215.5 lb

## 2018-12-26 DIAGNOSIS — B1920 Unspecified viral hepatitis C without hepatic coma: Secondary | ICD-10-CM

## 2018-12-26 DIAGNOSIS — C220 Liver cell carcinoma: Secondary | ICD-10-CM

## 2018-12-26 DIAGNOSIS — C7800 Secondary malignant neoplasm of unspecified lung: Secondary | ICD-10-CM | POA: Diagnosis not present

## 2018-12-26 DIAGNOSIS — Z8042 Family history of malignant neoplasm of prostate: Secondary | ICD-10-CM

## 2018-12-26 DIAGNOSIS — I10 Essential (primary) hypertension: Secondary | ICD-10-CM

## 2018-12-26 DIAGNOSIS — Z87891 Personal history of nicotine dependence: Secondary | ICD-10-CM

## 2018-12-26 DIAGNOSIS — R21 Rash and other nonspecific skin eruption: Secondary | ICD-10-CM | POA: Diagnosis not present

## 2018-12-26 DIAGNOSIS — R161 Splenomegaly, not elsewhere classified: Secondary | ICD-10-CM

## 2018-12-26 DIAGNOSIS — M7989 Other specified soft tissue disorders: Secondary | ICD-10-CM

## 2018-12-26 DIAGNOSIS — Z9221 Personal history of antineoplastic chemotherapy: Secondary | ICD-10-CM

## 2018-12-26 DIAGNOSIS — Z79899 Other long term (current) drug therapy: Secondary | ICD-10-CM

## 2018-12-26 DIAGNOSIS — Z800 Family history of malignant neoplasm of digestive organs: Secondary | ICD-10-CM

## 2018-12-26 DIAGNOSIS — R1011 Right upper quadrant pain: Secondary | ICD-10-CM | POA: Diagnosis not present

## 2018-12-26 DIAGNOSIS — A4902 Methicillin resistant Staphylococcus aureus infection, unspecified site: Secondary | ICD-10-CM

## 2018-12-26 DIAGNOSIS — F329 Major depressive disorder, single episode, unspecified: Secondary | ICD-10-CM

## 2018-12-26 DIAGNOSIS — Z5112 Encounter for antineoplastic immunotherapy: Secondary | ICD-10-CM | POA: Diagnosis not present

## 2018-12-26 LAB — COMPREHENSIVE METABOLIC PANEL
ALT: 39 U/L (ref 0–44)
AST: 48 U/L — ABNORMAL HIGH (ref 15–41)
Albumin: 3.2 g/dL — ABNORMAL LOW (ref 3.5–5.0)
Alkaline Phosphatase: 65 U/L (ref 38–126)
Anion gap: 10 (ref 5–15)
BUN: 25 mg/dL — ABNORMAL HIGH (ref 6–20)
CO2: 25 mmol/L (ref 22–32)
Calcium: 9 mg/dL (ref 8.9–10.3)
Chloride: 105 mmol/L (ref 98–111)
Creatinine, Ser: 0.74 mg/dL (ref 0.44–1.00)
GFR calc Af Amer: >60 mL/min (ref >60)
GFR calc non Af Amer: >60 mL/min (ref >60)
Glucose, Bld: 115 mg/dL — ABNORMAL HIGH (ref 70–99)
Potassium: 4 mmol/L (ref 3.5–5.1)
Sodium: 140 mmol/L (ref 135–145)
Total Bilirubin: 1.6 mg/dL — ABNORMAL HIGH (ref 0.3–1.2)
Total Protein: 8.5 g/dL — ABNORMAL HIGH (ref 6.5–8.1)

## 2018-12-26 LAB — CBC WITH DIFFERENTIAL/PLATELET
Abs Immature Granulocytes: 0.21 10*3/uL — ABNORMAL HIGH (ref 0.00–0.07)
Basophils Absolute: 0 10*3/uL (ref 0.0–0.1)
Basophils Relative: 0 %
Eosinophils Absolute: 0 10*3/uL (ref 0.0–0.5)
Eosinophils Relative: 0 %
HCT: 33.8 % — ABNORMAL LOW (ref 36.0–46.0)
Hemoglobin: 10.1 g/dL — ABNORMAL LOW (ref 12.0–15.0)
Immature Granulocytes: 1 %
Lymphocytes Relative: 5 %
Lymphs Abs: 1 10*3/uL (ref 0.7–4.0)
MCH: 27.4 pg (ref 26.0–34.0)
MCHC: 29.9 g/dL — ABNORMAL LOW (ref 30.0–36.0)
MCV: 91.8 fL (ref 80.0–100.0)
Monocytes Absolute: 1.2 10*3/uL — ABNORMAL HIGH (ref 0.1–1.0)
Monocytes Relative: 6 %
Neutro Abs: 18.7 10*3/uL — ABNORMAL HIGH (ref 1.7–7.7)
Neutrophils Relative %: 88 %
Platelets: 71 10*3/uL — ABNORMAL LOW (ref 150–400)
RBC: 3.68 MIL/uL — ABNORMAL LOW (ref 3.87–5.11)
RDW: 17.6 % — ABNORMAL HIGH (ref 11.5–15.5)
WBC: 21.1 10*3/uL — ABNORMAL HIGH (ref 4.0–10.5)
nRBC: 0 % (ref 0.0–0.2)

## 2018-12-26 LAB — PATHOLOGIST SMEAR REVIEW

## 2018-12-26 MED ORDER — SULFAMETHOXAZOLE-TRIMETHOPRIM 800-160 MG PO TABS: 1.0000 | ORAL_TABLET | Freq: Two times a day (BID) | ORAL | 0 refills | Status: AC

## 2018-12-26 NOTE — Patient Instructions (Signed)
[facility] at [facility] Discharge Instructions  You were seen today by Dr. Katragadda. He went over your recent lab results. He will see you back in 1 week for labs and follow up.   Thank you for choosing [facility] at [facility] to provide your oncology and hematology care.  To afford each patient quality time with our provider, please arrive at least 15 minutes before your scheduled appointment time.   If you have a lab appointment with the Cancer Center please come in thru the  Main Entrance and check in at the main information desk  You need to re-schedule your appointment should you arrive 10 or more minutes late.  We strive to give you quality time with our providers, and arriving late affects you and other patients whose appointments are after yours.  Also, if you no show three or more times for appointments you may be dismissed from the clinic at the providers discretion.     Again, thank you for choosing [facility].  Our hope is that these requests will decrease the amount of time that you wait before being seen by our physicians.       _____________________________________________________________  Should you have questions after your visit to [facility], please contact our office at [phone] between the hours of 8:00 a.m. and 4:30 p.m.  Voicemails left after 4:00 p.m. will not be returned until the following business day.  For prescription refill requests, have your pharmacy contact our office and allow 72 hours.    Cancer Center Support Programs:   > Cancer Support Group  2nd Tuesday of the month 1pm-2pm, Journey Room    

## 2018-12-26 NOTE — Assessment & Plan Note (Addendum)
1.  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 - An ultrasound of the abdomen on 10/11/2018 done at [facility] shows large mass in the right hepatic lobe measuring 13 x 7 x 10 cm. - Evaluated by Dr.Rehman with MRI on 11/01/2018 showing large hypervascular mass in the right lobe of the liver measuring 7.6 x 7.8 x 15.6 cm.  There are innumerable other smaller hypervascular lesions most compatible with satellite foci of [place].  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  No intra-/extrahepatic biliary ductal dilation.  Spleen is mildly enlarged at 14.3 cm.  Multiple prominent borderline enlarged and mildly enlarged upper abdominal lymph nodes noted, largest in the hepatoduodenal ligament measuring 11 mm in short axis. - AFP was 2.5.  Child's class A with total points 5. - I discussed the results of the CT CAP dated 11/17/2018.  There are about 18 pulmonary nodules scattered randomly in both lungs, largest measuring 0.9 cm.  There is right hepatic lobe mass measuring 10.8 x 8.1 cm.  Other scattered hypoenhancing nodules are present in the liver mostly in the right hepatic lobe.  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  Bone scan was negative. 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  I have considered anticoagulation but was reluctant to do it because of her varices and potential bleed. -I have also called and talked to interventional radiology to see if any  local interventions can be done for the tumor thrombus.  I was told there is none. - Based on IMbrave 150 study which showed a better six-month PFS rate of 55% and overall survival rate of 85% and better patient related quality of life outcomes, combination Atezolizumab and bevacizumab was recommended.  -Cycle 1 of Atezolizumab and bevacizumab on 11/23/2018. -Platelet count dropped to 5 on 12/14/2018.  She also developed small ulcerated lesions on her anterior abdominal wall and back. -She received IVIG 1 g/kg on 12/14/2018 and 12/15/2018.  She also received dexamethasone 40 mg x 4 days. -Platelet count improved to 71 today.  I have reviewed the peripheral blood smear.  No schistocytes were seen.  There is giant platelets seen. - She has these ulcerated lesions on her skin.  We will do a culture today.  We will start her on Bactrim 1 tablet twice daily, as she had a history of MRSA skin infection in the past. -She is also taking Lasix 20 mg daily as needed. -I would hold off on her treatment today and reevaluate her in 1 week with repeat labs.  2.  Right upper quadrant pain: -She is taking hydrocodone 5 mg every 6 hours as needed.  3.  Family history: -Maternal grandmother and 2 maternal aunts had stomach cancer. -One maternal uncle had prostate cancer.  4.  Hypertension: -She is on chlorthalidone 25 mg daily.

## 2018-12-26 NOTE — Progress Notes (Signed)
[address], [place] [address]   CLINIC:  Medical Oncology/Hematology  PCP:  Dettinger, Fransisca Kaufmann, MD [place] [address] [phone]   REASON FOR VISIT:  Follow-up for  Hepatocellular cancer   BRIEF ONCOLOGIC HISTORY:    Hepatocellular carcinoma ([facility])   11/15/2018 Initial Diagnosis    Hepatocellular carcinoma ([facility])    11/23/2018 -  Chemotherapy    The patient had bevacizumab (AVASTIN) 1,400 mg in sodium chloride 0.9 % 100 mL chemo infusion, 15.2 mg/kg = 1,375 mg, Intravenous,  Once, 1 of 6 cycles Administration: 1,400 mg (11/23/2018) atezolizumab (TECENTRIQ) 1,200 mg in sodium chloride 0.9 % 250 mL chemo infusion, 1,200 mg, Intravenous, Once, 1 of 6 cycles Administration: 1,200 mg (11/23/2018)  for chemotherapy treatment.       CANCER STAGING: Cancer Staging No matching staging information was found for the patient.   INTERVAL HISTORY:  Ms. Ericson 58 y.o. female returns for routine follow-up and consideration for next cycle of chemotherapy. She is here today alone. She states that her family came to visit her this weekend and brought her the "cooties", she states that she picked a bug out of her skin and has numerous sores and areas that itch. She states that she feels better today. We will get a culture on the areas today. She states that she is currently taking her fluid pill as directed. She states that she has swelling in her legs that are better.  Denies any nausea, vomiting, or diarrhea. Denies any new pains. Had not noticed any recent bleeding such as epistaxis, hematuria or hematochezia. Denies recent chest pain on exertion, pre-syncopal episodes, or palpitations. Denies any numbness or tingling in hands or feet. Denies any recent fevers, infections, or recent hospitalizations. Patient reports appetite at 100% and energy level at 25%.    REVIEW OF SYSTEMS:  Review of Systems  Cardiovascular: Positive for leg swelling.   Skin: Positive for wound. Negative for rash.  All other systems reviewed and are negative.    PAST MEDICAL/SURGICAL HISTORY:  Past Medical History:  Diagnosis Date  . Allergy   . Anxiety   . Arthritis   . Blood transfusion without reported diagnosis   . Cancer ([facility]) 1994   pre-cervical  . Depression   . GERD (gastroesophageal reflux disease)   . Hepatitis C   . Hypertension   . Substance abuse ([facility])    past cocaine user; smokes marijuana now   Past Surgical History:  Procedure Laterality Date  . CHOLECYSTECTOMY    . PORTACATH PLACEMENT Left 11/21/2018   Procedure: INSERTION PORT-A-CATH (attached catheter left subclavian);  Surgeon: Aviva Signs, MD;  Location: [facility];  Service: General;  Laterality: Left;     SOCIAL HISTORY:  Social History   Socioeconomic History  . Marital status: Single    Spouse name: Not on file  . Number of children: 5  . Years of education: Not on file  . Highest education level: Not on file  Occupational History  . Occupation: SSI  Social Needs  . Financial resource strain: Very hard  . Food insecurity:    Worry: Sometimes true    Inability: Sometimes true  . Transportation needs:    Medical: No    Non-medical: No  Tobacco Use  . Smoking status: Former Smoker    Packs/day: 0.25    Years: 40.00    Pack years: 10.00  . Smokeless tobacco: Never Used  Substance and Sexual Activity  .  Alcohol use: Not Currently    Alcohol/week: 3.0 standard drinks    Types: 3 Cans of beer per week    Comment: 6 pack on the weekend sometimes. No etoh during the weeks.   . Drug use: Yes    Types: Marijuana    Comment: 1 every other day  . Sexual activity: Never    Birth control/protection: Condom  Lifestyle  . Physical activity:    Days per week: Not on file    Minutes per session: Not on file  . Stress: Not on file  Relationships  . Social connections:    Talks on phone: Not on file    Gets together: Not on file    Attends religious service:  Not on file    Active member of club or organization: Not on file    Attends meetings of clubs or organizations: Not on file    Relationship status: Not on file  . Intimate partner violence:    Fear of current or ex partner: Not on file    Emotionally abused: Not on file    Physically abused: Not on file    Forced sexual activity: Not on file  Other Topics Concern  . Not on file  Social History Narrative  . Not on file    FAMILY HISTORY:  Family History  Problem Relation Age of Onset  . Diabetes Mother   . Heart disease Mother   . Mental illness Sister   . Mental illness Brother   . Mental illness Brother   . Alcohol abuse Brother   . Alcohol abuse Brother     CURRENT MEDICATIONS:  Outpatient Encounter Medications as of 12/26/2018  Medication Sig  . Atezolizumab (TECENTRIQ IV) Inject 1,200 mg into the vein every 21 ( twenty-one) days.  . Bevacizumab (AVASTIN IV) Inject 15 mg/kg into the vein every 21 ( twenty-one) days.  . chlorthalidone (HYGROTON) 25 MG tablet Take 1 tablet (25 mg total) by mouth daily.  . cyclobenzaprine (FLEXERIL) 10 MG tablet Take 1 tablet (10 mg total) by mouth 3 (three) times daily as needed for muscle spasms.  . furosemide (LASIX) 20 MG tablet Take 1 tablet (20 mg total) by mouth daily as needed.  Marland Kitchen HYDROcodone-acetaminophen (NORCO/VICODIN) 5-325 MG tablet Take 1 tablet by mouth every 8 (eight) hours as needed for moderate pain.  Marland Kitchen omeprazole (PRILOSEC) 20 MG capsule Take 1 capsule (20 mg total) by mouth daily.  Marland Kitchen triamcinolone (KENALOG) 0.025 % ointment APPLY AS DIRECTED TWICE DAILY.  . valACYclovir (VALTREX) 1000 MG tablet TAKE ONE TABLET BY MOUTH TWICE DAILY   No facility-administered encounter medications on file as of 12/26/2018.     ALLERGIES:  Allergies  Allergen Reactions  . Lisinopril Hives     PHYSICAL EXAM:  ECOG Performance status: 1  Vitals:   12/26/18 1015  BP: (!) 159/98  Pulse: (!) 115  Resp: 16  Temp: 97.7 F (36.5 C)   SpO2: 99%   Filed Weights   12/26/18 1015  Weight: 215 lb 8 oz (97.8 kg)    Physical Exam Vitals signs reviewed.  Constitutional:      Appearance: Normal appearance.  Cardiovascular:     Rate and Rhythm: Normal rate and regular rhythm.     Heart sounds: Normal heart sounds.  Pulmonary:     Effort: Pulmonary effort is normal.     Breath sounds: Normal breath sounds.  Abdominal:     General: There is no distension.     Palpations:  Abdomen is soft. There is no mass.  Musculoskeletal:        General: No swelling.  Skin:    General: Skin is warm.     Findings: Lesion present.  Neurological:     Mental Status: She is alert and oriented to person, place, and time.  Psychiatric:        Mood and Affect: Mood normal.        Behavior: Behavior normal.   Ulcerated skin lesions with erythematous base mostly on the anterior abdominal wall.   LABORATORY DATA:  I have reviewed the labs as listed.  CBC    Component Value Date/Time   WBC 21.1 (H) 12/26/2018 0951   RBC 3.68 (L) 12/26/2018 0951   HGB 10.1 (L) 12/26/2018 0951   HGB 13.9 09/23/2018 1405   HCT 33.8 (L) 12/26/2018 0951   HCT 41.8 09/23/2018 1405   PLT 71 (L) 12/26/2018 0951   PLT 238 09/23/2018 1405   MCV 91.8 12/26/2018 0951   MCV 92 09/23/2018 1405   MCH 27.4 12/26/2018 0951   MCHC 29.9 (L) 12/26/2018 0951   RDW 17.6 (H) 12/26/2018 0951   RDW 12.2 09/23/2018 1405   LYMPHSABS 1.0 12/26/2018 0951   LYMPHSABS 2.2 09/23/2018 1405   MONOABS 1.2 (H) 12/26/2018 0951   EOSABS 0.0 12/26/2018 0951   EOSABS 0.3 09/23/2018 1405   BASOSABS 0.0 12/26/2018 0951   BASOSABS 0.1 09/23/2018 1405   CMP Latest Ref Rng & Units 12/26/2018 12/23/2018 12/16/2018  Glucose 70 - 99 mg/dL 115(H) 217(H) 173(H)  BUN 6 - 20 mg/dL 25(H) 27(H) 29(H)  Creatinine 0.44 - 1.00 mg/dL 0.74 0.85 1.18(H)  Sodium 135 - 145 mmol/L 140 137 133(L)  Potassium 3.5 - 5.1 mmol/L 4.0 3.4(L) 3.5  Chloride 98 - 111 mmol/L 105 104 104  CO2 22 - 32 mmol/L 25 22  17(L)  Calcium 8.9 - 10.3 mg/dL 9.0 8.6(L) 8.5(L)  Total Protein 6.5 - 8.1 g/dL 8.5(H) 8.4(H) 9.8(H)  Total Bilirubin 0.3 - 1.2 mg/dL 1.6(H) 1.4(H) 1.3(H)  Alkaline Phos 38 - 126 U/L 65 62 55  AST 15 - 41 U/L 48(H) 44(H) 45(H)  ALT 0 - 44 U/L 39 29 21       DIAGNOSTIC IMAGING:  I have independently reviewed the scans and discussed with the patient.   I have reviewed Venita Lick LPN's note and agree with the documentation.  I personally performed a face-to-face visit, made revisions and my assessment and plan is as follows.    ASSESSMENT & PLAN:   Hepatocellular carcinoma ([facility]) 1.  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 - An ultrasound of the abdomen on 10/11/2018 done at [facility] shows large mass in the right hepatic lobe measuring 13 x 7 x 10 cm. - Evaluated by Dr.Rehman with MRI on 11/01/2018 showing large hypervascular mass in the right lobe of the liver measuring 7.6 x 7.8 x 15.6 cm.  There are innumerable other smaller hypervascular lesions most compatible with satellite foci of [place].  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  No intra-/extrahepatic biliary ductal dilation.  Spleen is  mildly enlarged at 14.3 cm.  Multiple prominent borderline enlarged and mildly enlarged upper abdominal lymph nodes noted, largest in the hepatoduodenal ligament measuring 11 mm in short axis. - AFP was 2.5.  Child's class A with total points 5. - I discussed the results of the CT CAP dated 11/17/2018.  There are about 18 pulmonary nodules scattered randomly in both lungs, largest measuring 0.9 cm.   There is right hepatic lobe mass measuring 10.8 x 8.1 cm.  Other scattered hypoenhancing nodules are present in the liver mostly in the right hepatic lobe.  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  Bone scan was negative. 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  I have considered anticoagulation but was reluctant to do it because of her varices and potential bleed. -I have also called and talked to interventional radiology to see if any local interventions can be done for the tumor thrombus.  I was told there is none. - Based on IMbrave 150 study which showed a better six-month PFS rate of 55% and overall survival rate of 85% and better patient related quality of life outcomes, combination Atezolizumab and bevacizumab was recommended.  -Cycle 1 of Atezolizumab and bevacizumab on 11/23/2018. -Platelet count dropped to 5 on 12/14/2018.  She also developed small ulcerated lesions on her anterior abdominal wall and back. -She received IVIG 1 g/kg on 12/14/2018 and 12/15/2018.  She also received dexamethasone 40 mg x 4 days. -Platelet count improved to 71 today.  I have reviewed the peripheral blood smear.  No schistocytes were seen.  There is giant platelets seen. - She has these ulcerated lesions on her skin.  We will do a culture today.  We will start her on Bactrim 1 tablet twice daily, as she had a history of MRSA skin infection in the past. -She is also taking Lasix 20 mg daily as needed. -I would hold off on her treatment today and reevaluate her in 1 week with repeat labs.  2.  Right upper quadrant pain: -She is taking hydrocodone 5 mg every 6 hours as needed.  3.  Family history: -Maternal grandmother and 2 maternal aunts had stomach cancer. -One maternal uncle had prostate cancer.  4.  Hypertension: -She is on chlorthalidone 25  mg daily.   Total time spent is 25 minutes with more than 50% of the time spent face-to-face discussing her hepatocellular carcinoma and skin condition, treatment plan and coordination of care.    Orders placed this encounter:  Orders Placed This Encounter  Procedures  . WOUND CULTURE  . Aerobic Culture (superficial specimen)      Derek Jack, MD [place] [phone]

## 2018-12-27 ENCOUNTER — Other Ambulatory Visit (HOSPITAL_COMMUNITY): Payer: Self-pay | Admitting: *Deleted

## 2018-12-27 ENCOUNTER — Telehealth (HOSPITAL_COMMUNITY): Payer: Self-pay | Admitting: *Deleted

## 2018-12-27 MED ORDER — HYDROCODONE-ACETAMINOPHEN 5-325 MG PO TABS: 1.0000 | ORAL_TABLET | Freq: Three times a day (TID) | ORAL | 0 refills | Status: AC | PRN

## 2018-12-27 NOTE — Telephone Encounter (Signed)
Patient's daughter called into clinic stating her mother needed her pain medication hydrocodone to be refilled. Prescription routed to provider refill was send to [facility] on file. Returned call back to daughter stating the refill was sent to the pharmacy to be refilled.

## 2018-12-28 LAB — AEROBIC CULTURE W GRAM STAIN (SUPERFICIAL SPECIMEN)
Culture: NORMAL
Gram Stain: NONE SEEN

## 2019-01-03 ENCOUNTER — Inpatient Hospital Stay (HOSPITAL_COMMUNITY): Payer: Medicaid Other

## 2019-01-03 ENCOUNTER — Encounter (HOSPITAL_COMMUNITY): Payer: Self-pay | Admitting: Hematology

## 2019-01-03 ENCOUNTER — Inpatient Hospital Stay (HOSPITAL_BASED_OUTPATIENT_CLINIC_OR_DEPARTMENT_OTHER): Payer: Medicaid Other | Admitting: Hematology

## 2019-01-03 ENCOUNTER — Other Ambulatory Visit: Payer: Self-pay

## 2019-01-03 VITALS — BP 154/82 | HR 124 | Temp 97.7°F | Resp 20 | Wt 226.8 lb

## 2019-01-03 VITALS — BP 155/86 | HR 107 | Temp 97.7°F | Resp 18

## 2019-01-03 DIAGNOSIS — R609 Edema, unspecified: Secondary | ICD-10-CM | POA: Diagnosis not present

## 2019-01-03 DIAGNOSIS — R1011 Right upper quadrant pain: Secondary | ICD-10-CM

## 2019-01-03 DIAGNOSIS — C220 Liver cell carcinoma: Secondary | ICD-10-CM

## 2019-01-03 DIAGNOSIS — Z9221 Personal history of antineoplastic chemotherapy: Secondary | ICD-10-CM

## 2019-01-03 DIAGNOSIS — R161 Splenomegaly, not elsewhere classified: Secondary | ICD-10-CM

## 2019-01-03 DIAGNOSIS — R21 Rash and other nonspecific skin eruption: Secondary | ICD-10-CM

## 2019-01-03 DIAGNOSIS — Z8042 Family history of malignant neoplasm of prostate: Secondary | ICD-10-CM

## 2019-01-03 DIAGNOSIS — Z5112 Encounter for antineoplastic immunotherapy: Secondary | ICD-10-CM | POA: Diagnosis not present

## 2019-01-03 DIAGNOSIS — I10 Essential (primary) hypertension: Secondary | ICD-10-CM

## 2019-01-03 DIAGNOSIS — M7989 Other specified soft tissue disorders: Secondary | ICD-10-CM

## 2019-01-03 DIAGNOSIS — C7800 Secondary malignant neoplasm of unspecified lung: Secondary | ICD-10-CM

## 2019-01-03 DIAGNOSIS — Z800 Family history of malignant neoplasm of digestive organs: Secondary | ICD-10-CM

## 2019-01-03 DIAGNOSIS — B1920 Unspecified viral hepatitis C without hepatic coma: Secondary | ICD-10-CM

## 2019-01-03 DIAGNOSIS — Z79899 Other long term (current) drug therapy: Secondary | ICD-10-CM

## 2019-01-03 DIAGNOSIS — F329 Major depressive disorder, single episode, unspecified: Secondary | ICD-10-CM

## 2019-01-03 DIAGNOSIS — Z87891 Personal history of nicotine dependence: Secondary | ICD-10-CM

## 2019-01-03 LAB — COMPREHENSIVE METABOLIC PANEL
ALT: 48 U/L — ABNORMAL HIGH (ref 0–44)
AST: 62 U/L — ABNORMAL HIGH (ref 15–41)
Albumin: 3.2 g/dL — ABNORMAL LOW (ref 3.5–5.0)
Alkaline Phosphatase: 87 U/L (ref 38–126)
Anion gap: 16 — ABNORMAL HIGH (ref 5–15)
BUN: 28 mg/dL — ABNORMAL HIGH (ref 6–20)
CO2: 19 mmol/L — ABNORMAL LOW (ref 22–32)
Calcium: 8.6 mg/dL — ABNORMAL LOW (ref 8.9–10.3)
Chloride: 104 mmol/L (ref 98–111)
Creatinine, Ser: 1.07 mg/dL — ABNORMAL HIGH (ref 0.44–1.00)
GFR calc Af Amer: >60 mL/min (ref >60)
GFR calc non Af Amer: 58 mL/min — ABNORMAL LOW (ref >60)
Glucose, Bld: 130 mg/dL — ABNORMAL HIGH (ref 70–99)
Potassium: 3.3 mmol/L — ABNORMAL LOW (ref 3.5–5.1)
Sodium: 139 mmol/L (ref 135–145)
Total Bilirubin: 2 mg/dL — ABNORMAL HIGH (ref 0.3–1.2)
Total Protein: 8 g/dL (ref 6.5–8.1)

## 2019-01-03 LAB — CBC WITH DIFFERENTIAL/PLATELET
Abs Immature Granulocytes: 0.09 10*3/uL — ABNORMAL HIGH (ref 0.00–0.07)
Basophils Absolute: 0 10*3/uL (ref 0.0–0.1)
Basophils Relative: 0 %
Eosinophils Absolute: 0 10*3/uL (ref 0.0–0.5)
Eosinophils Relative: 0 %
HCT: 34.4 % — ABNORMAL LOW (ref 36.0–46.0)
Hemoglobin: 10.2 g/dL — ABNORMAL LOW (ref 12.0–15.0)
Immature Granulocytes: 1 %
Lymphocytes Relative: 6 %
Lymphs Abs: 0.9 10*3/uL (ref 0.7–4.0)
MCH: 27.7 pg (ref 26.0–34.0)
MCHC: 29.7 g/dL — ABNORMAL LOW (ref 30.0–36.0)
MCV: 93.5 fL (ref 80.0–100.0)
Monocytes Absolute: 0.7 10*3/uL (ref 0.1–1.0)
Monocytes Relative: 4 %
Neutro Abs: 13.7 10*3/uL — ABNORMAL HIGH (ref 1.7–7.7)
Neutrophils Relative %: 89 %
Platelets: 127 10*3/uL — ABNORMAL LOW (ref 150–400)
RBC: 3.68 MIL/uL — ABNORMAL LOW (ref 3.87–5.11)
RDW: 18.1 % — ABNORMAL HIGH (ref 11.5–15.5)
WBC: 15.4 10*3/uL — ABNORMAL HIGH (ref 4.0–10.5)
nRBC: 0 % (ref 0.0–0.2)

## 2019-01-03 LAB — RETICULOCYTES
Immature Retic Fract: 30.4 % — ABNORMAL HIGH (ref 2.3–15.9)
RBC.: 3.68 MIL/uL — ABNORMAL LOW (ref 3.87–5.11)
Retic Count, Absolute: 106.4 10*3/uL (ref 19.0–186.0)
Retic Ct Pct: 2.9 % (ref 0.4–3.1)

## 2019-01-03 LAB — LACTATE DEHYDROGENASE: LDH: 382 U/L — ABNORMAL HIGH (ref 98–192)

## 2019-01-03 MED ORDER — SPIRONOLACTONE 25 MG PO TABS: 25.0000 mg | ORAL_TABLET | Freq: Every day | ORAL | 0 refills | Status: AC

## 2019-01-03 MED ORDER — SODIUM CHLORIDE 0.9% FLUSH
10.0000 mL | INTRAVENOUS | Status: DC | PRN
  Administered 2019-01-03: 10 mL
  Filled 2019-01-03: qty 10

## 2019-01-03 MED ORDER — SODIUM CHLORIDE 0.9 % IV SOLN
15.2000 mg/kg | Freq: Once | INTRAVENOUS | Status: AC
  Administered 2019-01-03: 1400 mg via INTRAVENOUS
  Filled 2019-01-03: qty 8

## 2019-01-03 MED ORDER — SODIUM CHLORIDE 0.9 % IV SOLN
1200.0000 mg | Freq: Once | INTRAVENOUS | Status: AC
  Administered 2019-01-03: 1200 mg via INTRAVENOUS
  Filled 2019-01-03: qty 20

## 2019-01-03 MED ORDER — SODIUM CHLORIDE 0.9 % IV SOLN
Freq: Once | INTRAVENOUS | Status: AC
  Administered 2019-01-03: 10:00:00 via INTRAVENOUS

## 2019-01-03 MED ORDER — HEPARIN SOD (PORK) LOCK FLUSH 100 UNIT/ML IV SOLN
500.0000 [IU] | Freq: Once | INTRAVENOUS | Status: AC | PRN
  Administered 2019-01-03: 500 [IU]

## 2019-01-03 NOTE — Progress Notes (Signed)
[address], [place] [address]   CLINIC:  Medical Oncology/Hematology  PCP:  Dettinger, Fransisca Kaufmann, MD [place] [address] [phone]   REASON FOR VISIT:  Follow-up for hepatocellular carcinoma.   BRIEF ONCOLOGIC HISTORY:    Hepatocellular carcinoma ([facility])   11/15/2018 Initial Diagnosis    Hepatocellular carcinoma ([facility])    11/23/2018 -  Chemotherapy    The patient had bevacizumab (AVASTIN) 1,400 mg in sodium chloride 0.9 % 100 mL chemo infusion, 15.2 mg/kg = 1,375 mg, Intravenous,  Once, 2 of 6 cycles Administration: 1,400 mg (11/23/2018) atezolizumab (TECENTRIQ) 1,200 mg in sodium chloride 0.9 % 250 mL chemo infusion, 1,200 mg, Intravenous, Once, 2 of 6 cycles Administration: 1,200 mg (11/23/2018)  for chemotherapy treatment.       CANCER STAGING: Cancer Staging No matching staging information was found for the patient.   INTERVAL HISTORY:  Ms. Grissett 58 y.o. female returns for routine follow-up and consideration for next cycle of chemotherapy.  She reported worsening of swelling of the legs.  Denies any nosebleeds or bleeding.  She does not report any new onset rash.  She is currently taking Lasix 20 mg daily.  Appetite is 100% and energy levels are 25%.  Denies any fevers or infections.    REVIEW OF SYSTEMS:  Review of Systems  Cardiovascular: Positive for leg swelling.  All other systems reviewed and are negative.    PAST MEDICAL/SURGICAL HISTORY:  Past Medical History:  Diagnosis Date  . Allergy   . Anxiety   . Arthritis   . Blood transfusion without reported diagnosis   . Cancer ([facility]) 1994   pre-cervical  . Depression   . GERD (gastroesophageal reflux disease)   . Hepatitis C   . Hypertension   . Substance abuse ([facility])    past cocaine user; smokes marijuana now   Past Surgical History:  Procedure Laterality Date  . CHOLECYSTECTOMY    . PORTACATH PLACEMENT Left 11/21/2018   Procedure: INSERTION  PORT-A-CATH (attached catheter left subclavian);  Surgeon: Aviva Signs, MD;  Location: [facility];  Service: General;  Laterality: Left;     SOCIAL HISTORY:  Social History   Socioeconomic History  . Marital status: Single    Spouse name: Not on file  . Number of children: 5  . Years of education: Not on file  . Highest education level: Not on file  Occupational History  . Occupation: SSI  Social Needs  . Financial resource strain: Very hard  . Food insecurity:    Worry: Sometimes true    Inability: Sometimes true  . Transportation needs:    Medical: No    Non-medical: No  Tobacco Use  . Smoking status: Former Smoker    Packs/day: 0.25    Years: 40.00    Pack years: 10.00  . Smokeless tobacco: Never Used  Substance and Sexual Activity  . Alcohol use: Not Currently    Alcohol/week: 3.0 standard drinks    Types: 3 Cans of beer per week    Comment: 6 pack on the weekend sometimes. No etoh during the weeks.   . Drug use: Yes    Types: Marijuana    Comment: 1 every other day  . Sexual activity: Never    Birth control/protection: Condom  Lifestyle  . Physical activity:    Days per week: Not on file    Minutes per session: Not on file  . Stress: Not on file  Relationships  .  Social connections:    Talks on phone: Not on file    Gets together: Not on file    Attends religious service: Not on file    Active member of club or organization: Not on file    Attends meetings of clubs or organizations: Not on file    Relationship status: Not on file  . Intimate partner violence:    Fear of current or ex partner: Not on file    Emotionally abused: Not on file    Physically abused: Not on file    Forced sexual activity: Not on file  Other Topics Concern  . Not on file  Social History Narrative  . Not on file    FAMILY HISTORY:  Family History  Problem Relation Age of Onset  . Diabetes Mother   . Heart disease Mother   . Mental illness Sister   . Mental illness  Brother   . Mental illness Brother   . Alcohol abuse Brother   . Alcohol abuse Brother     CURRENT MEDICATIONS:  Outpatient Encounter Medications as of 01/03/2019  Medication Sig  . Atezolizumab (TECENTRIQ IV) Inject 1,200 mg into the vein every 21 ( twenty-one) days.  . Bevacizumab (AVASTIN IV) Inject 15 mg/kg into the vein every 21 ( twenty-one) days.  . chlorthalidone (HYGROTON) 25 MG tablet Take 1 tablet (25 mg total) by mouth daily.  . cyclobenzaprine (FLEXERIL) 10 MG tablet Take 1 tablet (10 mg total) by mouth 3 (three) times daily as needed for muscle spasms.  . furosemide (LASIX) 20 MG tablet Take 1 tablet (20 mg total) by mouth daily as needed.  Marland Kitchen HYDROcodone-acetaminophen (NORCO/VICODIN) 5-325 MG tablet Take 1 tablet by mouth every 8 (eight) hours as needed for moderate pain.  Marland Kitchen omeprazole (PRILOSEC) 20 MG capsule Take 1 capsule (20 mg total) by mouth daily.  Marland Kitchen spironolactone (ALDACTONE) 25 MG tablet Take 1 tablet (25 mg total) by mouth daily.  Marland Kitchen sulfamethoxazole-trimethoprim (BACTRIM DS) 800-160 MG tablet Take 1 tablet by mouth 2 (two) times daily.  Marland Kitchen triamcinolone (KENALOG) 0.025 % ointment APPLY AS DIRECTED TWICE DAILY.  . valACYclovir (VALTREX) 1000 MG tablet TAKE ONE TABLET BY MOUTH TWICE DAILY   No facility-administered encounter medications on file as of 01/03/2019.     ALLERGIES:  Allergies  Allergen Reactions  . Lisinopril Hives     PHYSICAL EXAM:  ECOG Performance status: 1  Vitals:   01/03/19 0823  BP: (!) 154/82  Pulse: (!) 124  Resp: 20  Temp: 97.7 F (36.5 C)  SpO2: 100%   Filed Weights   01/03/19 0823  Weight: 226 lb 12.8 oz (102.9 kg)    Physical Exam Vitals signs reviewed.  Constitutional:      Appearance: Normal appearance.  Cardiovascular:     Rate and Rhythm: Normal rate and regular rhythm.     Heart sounds: Normal heart sounds.  Pulmonary:     Effort: Pulmonary effort is normal.     Breath sounds: Normal breath sounds.  Abdominal:      General: There is distension.     Palpations: Abdomen is soft. There is no mass.  Musculoskeletal:     Right lower leg: Edema present.     Left lower leg: Edema present.  Skin:    General: Skin is warm.  Neurological:     General: No focal deficit present.     Mental Status: She is alert and oriented to person, place, and time.  Psychiatric:  Mood and Affect: Mood normal.        Behavior: Behavior normal.      LABORATORY DATA:  I have reviewed the labs as listed.  CBC    Component Value Date/Time   WBC 15.4 (H) 01/03/2019 0820   RBC 3.68 (L) 01/03/2019 0820   RBC 3.68 (L) 01/03/2019 0820   HGB 10.2 (L) 01/03/2019 0820   HGB 13.9 09/23/2018 1405   HCT 34.4 (L) 01/03/2019 0820   HCT 41.8 09/23/2018 1405   PLT 127 (L) 01/03/2019 0820   PLT 238 09/23/2018 1405   MCV 93.5 01/03/2019 0820   MCV 92 09/23/2018 1405   MCH 27.7 01/03/2019 0820   MCHC 29.7 (L) 01/03/2019 0820   RDW 18.1 (H) 01/03/2019 0820   RDW 12.2 09/23/2018 1405   LYMPHSABS 0.9 01/03/2019 0820   LYMPHSABS 2.2 09/23/2018 1405   MONOABS 0.7 01/03/2019 0820   EOSABS 0.0 01/03/2019 0820   EOSABS 0.3 09/23/2018 1405   BASOSABS 0.0 01/03/2019 0820   BASOSABS 0.1 09/23/2018 1405   CMP Latest Ref Rng & Units 01/03/2019 12/26/2018 12/23/2018  Glucose 70 - 99 mg/dL 130(H) 115(H) 217(H)  BUN 6 - 20 mg/dL 28(H) 25(H) 27(H)  Creatinine 0.44 - 1.00 mg/dL 1.07(H) 0.74 0.85  Sodium 135 - 145 mmol/L 139 140 137  Potassium 3.5 - 5.1 mmol/L 3.3(L) 4.0 3.4(L)  Chloride 98 - 111 mmol/L 104 105 104  CO2 22 - 32 mmol/L 19(L) 25 22  Calcium 8.9 - 10.3 mg/dL 8.6(L) 9.0 8.6(L)  Total Protein 6.5 - 8.1 g/dL 8.0 8.5(H) 8.4(H)  Total Bilirubin 0.3 - 1.2 mg/dL 2.0(H) 1.6(H) 1.4(H)  Alkaline Phos 38 - 126 U/L 87 65 62  AST 15 - 41 U/L 62(H) 48(H) 44(H)  ALT 0 - 44 U/L 48(H) 39 29       DIAGNOSTIC IMAGING:  I have independently reviewed the scans and discussed with the patient.   I have reviewed Venita Lick  LPN's note and agree with the documentation.  I personally performed a face-to-face visit, made revisions and my assessment and plan is as follows.    ASSESSMENT & PLAN:   Hepatocellular carcinoma ([facility]) 1.  Metastatic hepatocellular carcinoma to the lungs: - Presentation with 46-month history of right upper quadrant pain. 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 - An ultrasound of the abdomen on 10/11/2018 done at [facility] shows large mass in the right hepatic lobe measuring 13 x 7 x 10 cm. - Evaluated by Dr.Rehman with MRI on 11/01/2018 showing large hypervascular mass in the right lobe of the liver measuring 7.6 x 7.8 x 15.6 cm.  There are innumerable other smaller hypervascular lesions most compatible with satellite foci of [place].  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  No intra-/extrahepatic biliary ductal dilation.  Spleen is mildly enlarged at 14.3 cm.  Multiple prominent borderline enlarged and mildly enlarged upper abdominal lymph nodes noted, largest in the hepatoduodenal ligament measuring 11 mm in short axis. - AFP was 2.5.  Child's class A with total points 5. - I discussed the results of the CT CAP dated 11/17/2018.  There are about 18 pulmonary nodules scattered randomly in both lungs, largest measuring 0.9  cm.  There is right hepatic lobe mass measuring 10.8 x 8.1 cm.  Other scattered hypoenhancing nodules are present in the liver mostly in the right hepatic lobe.  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  Bone scan was negative. - I have explained her that the presence of tumor thrombus in the hepatic vein extending into the IVC  is a poor prognostic indicator.  I have considered anticoagulation but was reluctant to do it because of her varices and potential bleed. -I have also called and talked to interventional radiology to see if any local interventions can be done for the tumor thrombus.  I was told there is none. - Based on IMbrave 150 study which showed a better six-month PFS rate of 55% and overall survival rate of 85% and better patient related quality of life outcomes, combination Atezolizumab and bevacizumab was recommended.  -Cycle 1 of Atezolizumab and bevacizumab on 11/23/2018. -Platelet count dropped to 5 on 12/14/2018.  She also developed small ulcerated lesions on her anterior abdominal wall and back. -She received IVIG 1 g/kg on 12/14/2018 and 12/15/2018.  She also received dexamethasone 40 mg x 4 days. - Reviewed the peripheral blood smear.  No schistocytes were seen.  There is giant platelets seen. - Platelet count has improved to 127k. She will proceed with Cycle 2 today. She will return Friday for repeat CBC to monitor for thrombocytopenia.   2. Edema - Pt has developed significant edema over the past week with 10lb weight gain. Recommend increase Lasix to 40 mg daily and add spironolactone 25 mg daily. She will return to clinic Friday for repeat BMP to monitor Potassium level.  3. Skin Lesion - She has generalized ulcerated lesions on her skin. Wound culture was negative. Pt states she thinks she has bed bugs.   3.  Right upper quadrant pain: -She is taking hydrocodone 5 mg every 6 hours as needed.  4.  Family history: -Maternal grandmother and 2 maternal aunts had stomach cancer. -One maternal uncle had prostate cancer.  5.  Hypertension: -She is on chlorthalidone 25 mg daily.   Total time spent is 25 minutes with more than 50% of the time spent face-to-face discussing treatment plan and coordination of care.    Orders placed this encounter:  Orders Placed This Encounter  Procedures  . Basic  metabolic panel  . CBC with Differential      Derek Jack, MD [place] [phone]

## 2019-01-03 NOTE — Assessment & Plan Note (Addendum)
1.  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 - An ultrasound of the abdomen on 10/11/2018 done at [facility] shows large mass in the right hepatic lobe measuring 13 x 7 x 10 cm. - Evaluated by Dr.Rehman with MRI on 11/01/2018 showing large hypervascular mass in the right lobe of the liver measuring 7.6 x 7.8 x 15.6 cm.  There are innumerable other smaller hypervascular lesions most compatible with satellite foci of [place].  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  No intra-/extrahepatic biliary ductal dilation.  Spleen is mildly enlarged at 14.3 cm.  Multiple prominent borderline enlarged and mildly enlarged upper abdominal lymph nodes noted, largest in the hepatoduodenal ligament measuring 11 mm in short axis. - AFP was 2.5.  Child's class A with total points 5. - I discussed the results of the CT CAP dated 11/17/2018.  There are about 18 pulmonary nodules scattered randomly in both lungs, largest measuring 0.9 cm.  There is right hepatic lobe mass measuring 10.8 x 8.1 cm.  Other scattered hypoenhancing nodules are present in the liver mostly in the right hepatic lobe.  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  Bone scan was negative. 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  I have considered anticoagulation but was reluctant to do it because of her varices and potential bleed. -I have also called and talked to interventional radiology to see if any  local interventions can be done for the tumor thrombus.  I was told there is none. - Based on IMbrave 150 study which showed a better six-month PFS rate of 55% and overall survival rate of 85% and better patient related quality of life outcomes, combination Atezolizumab and bevacizumab was recommended.  -Cycle 1 of Atezolizumab and bevacizumab on 11/23/2018. -Platelet count dropped to 5 on 12/14/2018.  She also developed small ulcerated lesions on her anterior abdominal wall and back. -She received IVIG 1 g/kg on 12/14/2018 and 12/15/2018.  She also received dexamethasone 40 mg x 4 days. - Reviewed the peripheral blood smear.  No schistocytes were seen.  There is giant platelets seen. - Platelet count has improved to 127k. She will proceed with Cycle 2 today. She will return Friday for repeat CBC to monitor for thrombocytopenia.   2. Edema - Pt has developed significant edema over the past week with 10lb weight gain. Recommend increase Lasix to 40 mg daily and add spironolactone 25 mg daily. She will return to clinic Friday for repeat BMP to monitor Potassium level.  3. Skin Lesion - She has generalized ulcerated lesions on her skin. Wound culture was negative. Pt states she thinks she has bed bugs.   3.  Right upper quadrant pain: -She is taking hydrocodone 5 mg every 6 hours as needed.  4.  Family history: -Maternal grandmother and 2 maternal aunts had stomach cancer. -One maternal uncle had prostate cancer.  5.  Hypertension: -She is on chlorthalidone 25 mg daily.

## 2019-01-03 NOTE — Progress Notes (Signed)
Patient for treatment today.  Patient stated increased SOB and leg swelling.  Rated abdominal tenderness 2 for today.  Dr. Delton Coombes notified for oncology follow up visit.  Patient seen by Dr. Delton Coombes with lab review of potassium 3.3 today.  Ok to treat today with heart rate above 100.  Pharmacy notified.    Patient tolerated chemotherapy with no complaints voiced.  Port site clean and dry with no bruising or swelling noted at site.  Good blood return noted before and after administration of chemotherapy.  Band aid applied.  Patient left ambulatory with VSS and no s/s of distress noted.

## 2019-01-03 NOTE — Patient Instructions (Signed)
Bedbugs    Bedbugs are tiny bugs that live in and around beds. During the day, they stay hidden. At night, they come out and bite.  Where are bedbugs found?  Bedbugs can be found anywhere. It does not matter if a place is clean or dirty. Bedbugs are found in:  · Hotels.  · Shelters.  · Dorms.  · Hospitals.  · Nursing homes.  · Places where there are many birds or bats.  What are bedbug bites like?    A bedbug bite makes a small red bump with a darker red dot in the middle. The bump may show soon after you are bitten or one or more days later. Bedbug bites usually do not hurt, but they may itch.  Most people do not need treatment for bedbug bites. The bumps usually go away on their own in a few days.  If you have a lot of bedbug bites and they feel very itchy:  · Do not scratch the bite areas.  · You may put one of these on the bite area as told by your doctor:  ? Baking soda paste. Make this by adding water to baking soda.  ? Cortisone cream.  ? Calamine lotion.  How do I check for bedbugs?  Adult bedbugs are reddish-brown, oval, and flat. They are very small, and they cannot fly. Young bedbugs are whitish-yellow and are smaller than the adult bedbugs.  Use a flashlight to look for bedbugs in these places:  · On mattresses, bed frames, headboards, and box springs.  · On drapes and curtains in bedrooms.  · Under the carpet in bedrooms.  · Behind electrical outlets.  · Behind any wallpaper that is peeling.  · Inside luggage.  Also look for black or red spots or stains on or near the bed.  What should I do if I find bedbugs?  When traveling  Check your clothes, suitcase, and belongings for bedbugs before you go back home. You may want to throw away anything that has bedbugs on it.  At home  Your bedroom may need to be treated by a pest control expert. You may also need to throw away mattresses or luggage. To help stop bedbugs from coming back, you may want to:  · Wash your clothes and bedding in water that is hotter  than 120°F (48.9°C). Dry them on a hot setting.  · Put a plastic cover over your mattress.  · When you sleep, wear pajamas that have long sleeves and pant legs. Bedbugs usually bite skin that is not covered.  · Vacuum often around the bed and in all of the cracks where the bugs might hide.  · Check all used furniture, bedding, or clothes that you bring into your home.  · Get rid of bird nests and bat roosts that are near your home.  Where to find more information  · U.[place]. Environmental Protection Agency (EPA): www.epa.gov/bedbugs  Summary  · Bedbugs are tiny bugs that live in and around beds.  · Bedbugs are often found in hotels, shelters, dorms, hospitals, and nursing homes.  · A bedbug bite makes a small red bump with a darker red dot in the middle.  · Bedbug bites usually do not hurt, but they may itch. Most people do not need treatment for the bites.  · If you find bedbugs at home, your bedroom may need to be treated by a pest control expert.  This information is not intended to replace advice given 

## 2019-01-03 NOTE — Patient Instructions (Signed)
[place] at [facility] Discharge Instructions  You were seen today by Dr. Delton Coombes. He went over your recent lab results. He will see you back in 1 week for labs and follow up.  Start taking 2 fluid pills every morning. Take Spironilactone along with the lasix every morning. Low sodium diet.   Thank you for choosing [place] at [facility] to provide your oncology and hematology care.  To afford each patient quality time with our provider, please arrive at least 15 minutes before your scheduled appointment time.   If you have a lab appointment with the [facility] please come in thru the  Main Entrance and check in at the main information desk  You need to re-schedule your appointment should you arrive 10 or more minutes late.  We strive to give you quality time with our providers, and arriving late affects you and other patients whose appointments are after yours.  Also, if you no show three or more times for appointments you may be dismissed from the clinic at the providers discretion.     Again, thank you for choosing [facility].  Our hope is that these requests will decrease the amount of time that you wait before being seen by our physicians.       _____________________________________________________________  Should you have questions after your visit to [facility], please contact our office at [phone] between the hours of 8:00 a.m. and 4:30 p.m.  Voicemails left after 4:00 p.m. will not be returned until the following business day.  For prescription refill requests, have your pharmacy contact our office and allow 72 hours.    Cancer Center Support Programs:   > Cancer Support Group  2nd Tuesday of the month 1pm-2pm, Journey Room

## 2019-01-04 ENCOUNTER — Other Ambulatory Visit: Payer: Self-pay | Admitting: Family Medicine

## 2019-01-04 DIAGNOSIS — B0089 Other herpesviral infection: Secondary | ICD-10-CM

## 2019-01-11 ENCOUNTER — Other Ambulatory Visit (HOSPITAL_COMMUNITY): Payer: Medicaid Other

## 2019-01-11 ENCOUNTER — Ambulatory Visit (HOSPITAL_COMMUNITY): Payer: Medicaid Other | Admitting: Nurse Practitioner

## 2019-01-17 ENCOUNTER — Other Ambulatory Visit (HOSPITAL_COMMUNITY): Payer: Medicaid Other

## 2019-01-17 ENCOUNTER — Ambulatory Visit (HOSPITAL_COMMUNITY): Payer: Medicaid Other | Admitting: Hematology

## 2019-01-24 ENCOUNTER — Ambulatory Visit (HOSPITAL_COMMUNITY): Payer: Medicaid Other

## 2019-01-24 ENCOUNTER — Ambulatory Visit (HOSPITAL_COMMUNITY): Payer: Medicaid Other | Admitting: Hematology

## 2019-01-24 ENCOUNTER — Other Ambulatory Visit (HOSPITAL_COMMUNITY): Payer: Medicaid Other

## 2019-02-15 DEATH — deceased

## 2019-03-29 ENCOUNTER — Ambulatory Visit: Payer: Medicaid Other | Admitting: Family Medicine

## 2019-03-31 ENCOUNTER — Ambulatory Visit: Payer: Medicaid Other | Admitting: Family Medicine

## 2020-12-12 IMAGING — CT CT CHEST WITH CONTRAST
2 of 4 series · 12 of 36 positions shown, 15 images · IV contrast (Isovue)
Comparison: 11/01/2018 MRI

CLINICAL DATA: Hepatocellular carcinoma staging workup

EXAM:
CT CHEST, ABDOMEN, AND PELVIS WITH CONTRAST
TECHNIQUE: Multidetector CT imaging of the chest, abdomen and pelvis was
performed following the standard protocol during bolus
administration of intravenous contrast.
CONTRAST:  100mL OMNIPAQUE IOHEXOL 300 MG/ML  SOLN

[Series 2: cap with · axial · 0.74mm/px · z∈[+926,+1462]mm · 9 of 128 slices shown, 12 images]
[im 14/128  mediastinal]
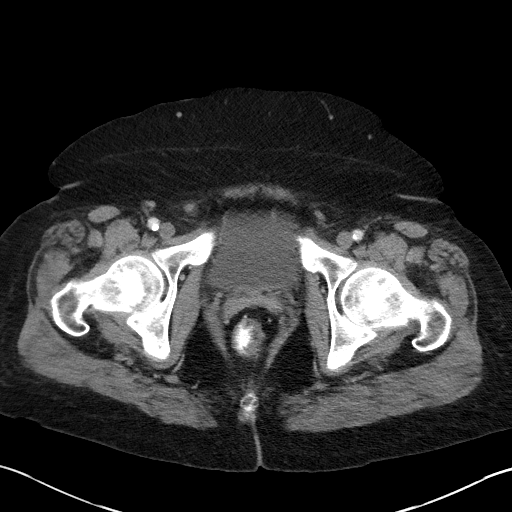
[im 14/128  lung]
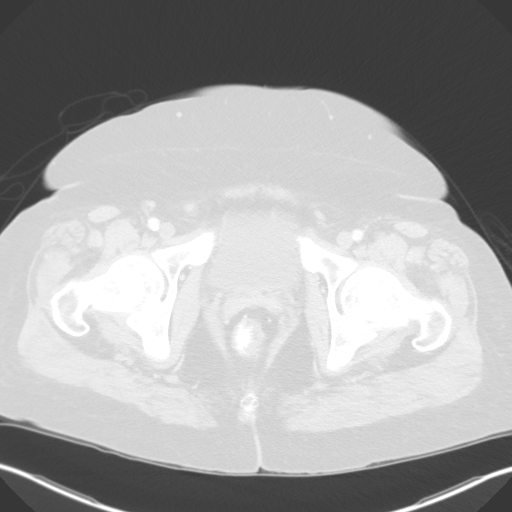
[im 27/128  lung]
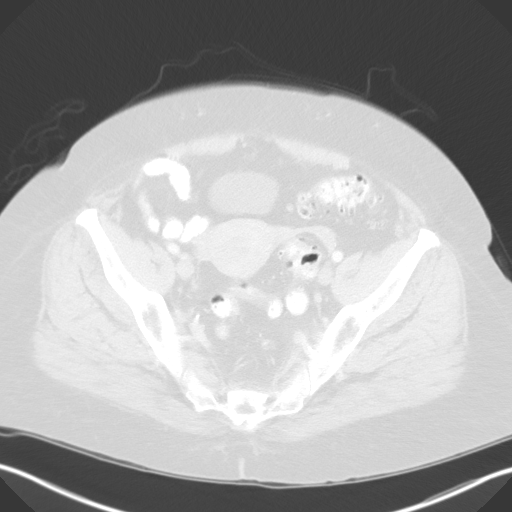
[im 41/128  lung]
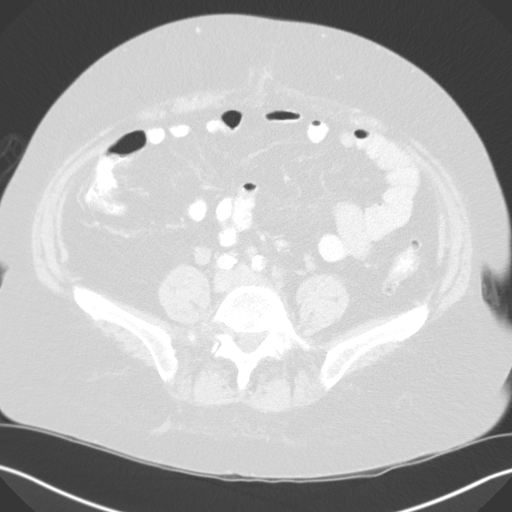
[im 54/128  lung]
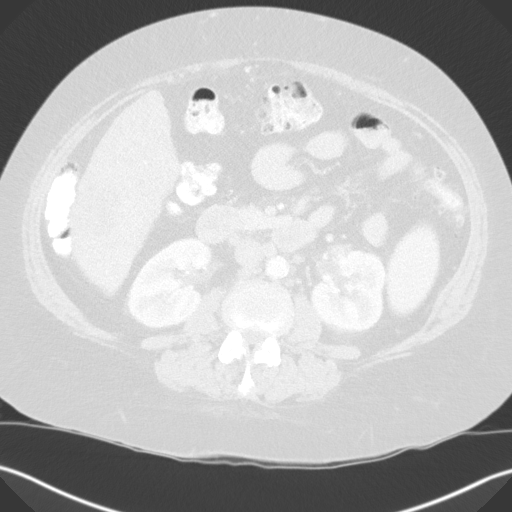
[im 67/128  mediastinal]
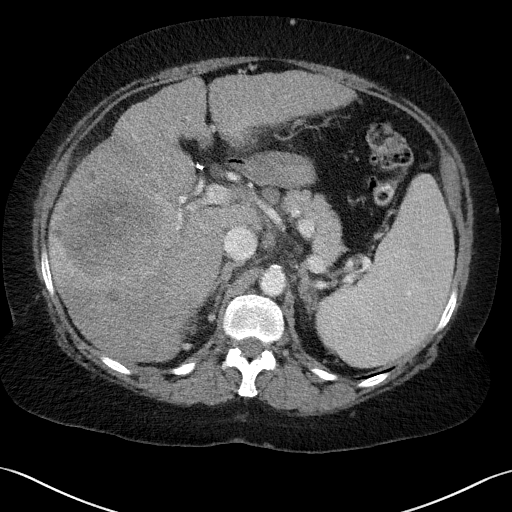
[im 67/128  lung]
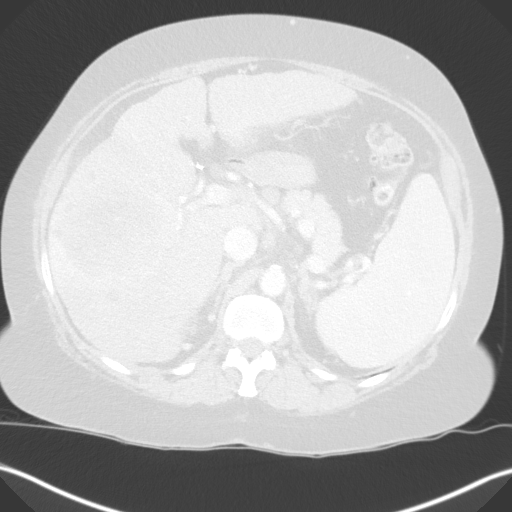
[im 81/128  lung]
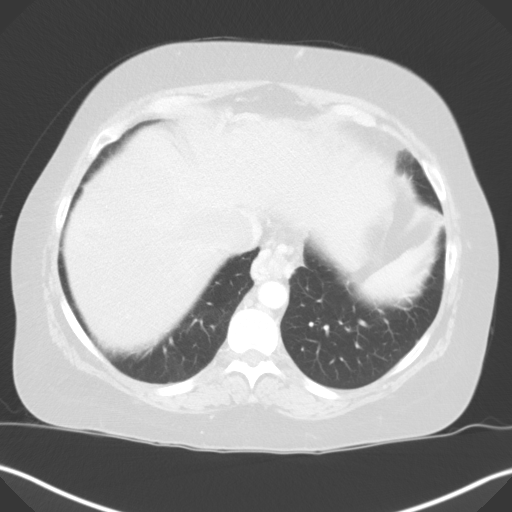
[im 94/128  lung]
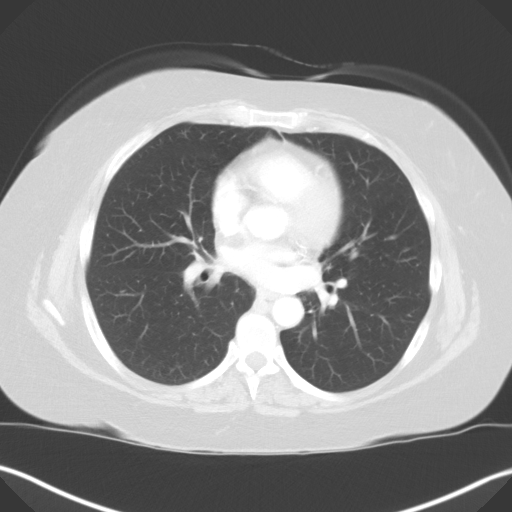
[im 107/128  lung]
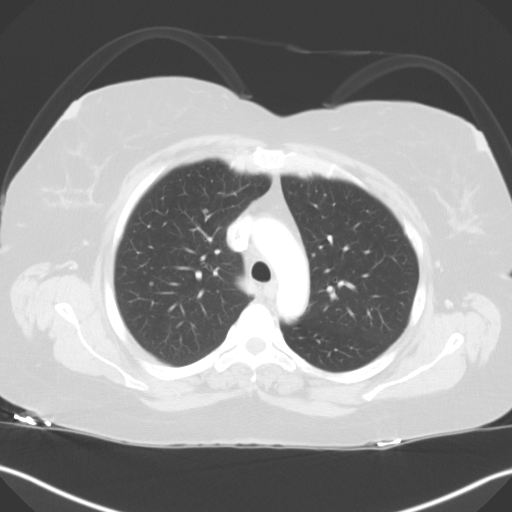
[im 121/128  mediastinal]
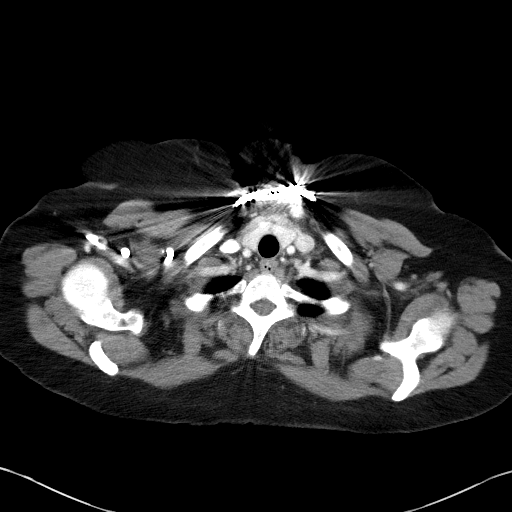
[im 121/128  lung]
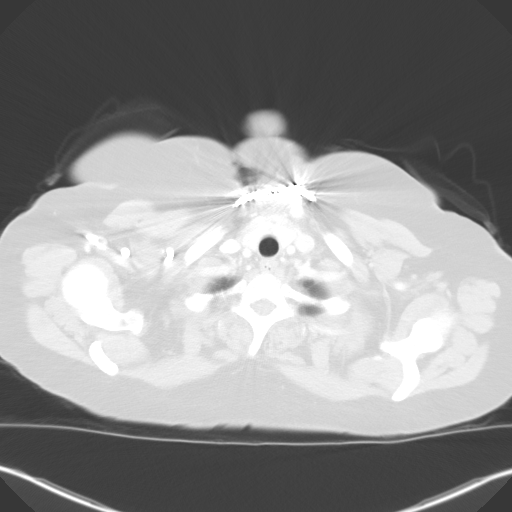

[Series 4: coronals · coronal · 0.71mm/px · 3 of 144 slices shown]
[im 29/144  lung]
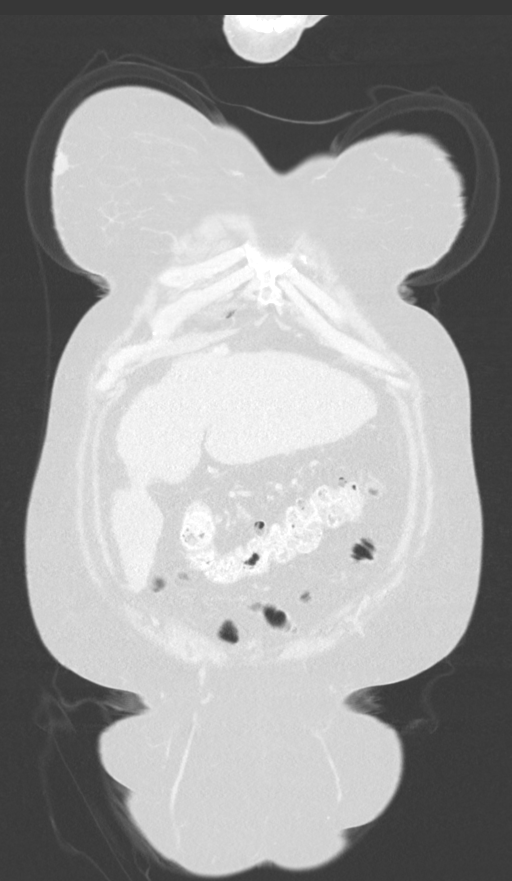
[im 58/144  lung]
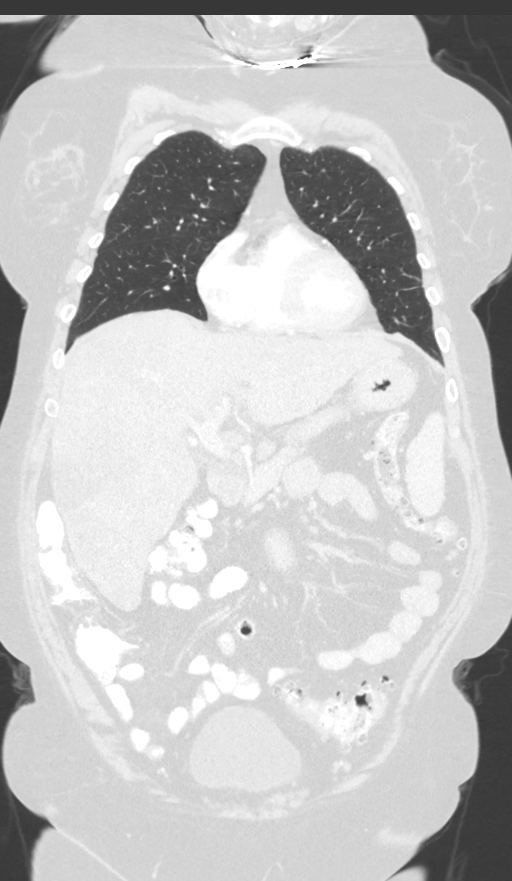
[im 86/144  lung]
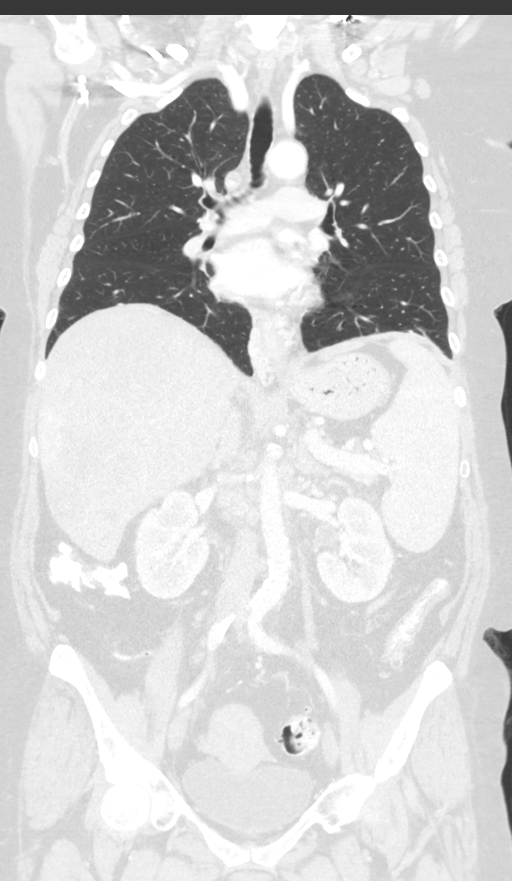

[12 of 36 positions shown; findings below may reference images not displayed]

FINDINGS: CT CHEST FINDINGS

Cardiovascular: Coronary and aortic arch atherosclerotic
calcification. Uphill paraesophageal varices adjacent to the distal
esophagus. Which collateral vascularization between the right
internal mammary vein extending across the diaphragm into the right
portal vein

Mediastinum/Nodes: No adenopathy identified in the chest.

Lungs/Pleura: About 18 pulmonary nodules are identified scattered
randomly in both lungs. One of the larger nodules measures 0.9 by
0.7 cm in the right lower lobe on image 105/3.

Musculoskeletal: No compelling findings of osseous metastatic
disease.

CT ABDOMEN PELVIS FINDINGS

Hepatobiliary: On image [DATE], hypodensity associated with the
dominant right hepatic lobe mass measures proximally 10.8 by 8.1 cm.
Other scattered hypoenhancing nodules are present in the liver,
mostly in the right hepatic lobe, favoring multifocal disease. There
is tumor thrombus in the right hepatic vein extending into the IVC
and causing prominent narrowing of the IVC just below the right
atrium. This is similar to the recent MRI.

Nodular contour the liver compatible cirrhosis.  Cholecystectomy.

Pancreas: Unremarkable

Spleen: Moderate splenomegaly.

Adrenals/Urinary Tract: Unremarkable

Stomach/Bowel: Sigmoid colon diverticulosis.

Vascular/Lymphatic: Aortoiliac atherosclerotic vascular disease.
Gastrohepatic varices. Celiac, porta hepatis, peripancreatic, and
retroperitoneal lymph nodes are mild to moderately enlarged. For
example a peripancreatic lymph node measures 1.2 cm in short axis on
image 62/2 and a left periaortic lymph node measures 1.2 cm in short
axis on image 73/2. These lymph nodes could be reactive or
metastatic. A retrocaval node is prominent at 1.5 cm in short axis,
image 71/2.

Reproductive: The endometrium is potentially thickened at about
cm in diameter.

Other: No supplemental non-categorized findings.

Musculoskeletal: Solid interbody fusion and facet fusion at L4-5.
IMPRESSION: 1. Dominant large right hepatic lobe mass with multifocal
involvement in the liver (especially the right hepatic lobe) and
with tumor thrombus extending into the hepatic vein and IVC.
Appearance compatible with hepatocellular carcinoma.
2. There are about 18 pulmonary nodules ranging in size up to 0.9 cm
in long axis. High suspicion for metastatic disease to the lungs.
3. Portal venous hypertension, with uphill paraesophageal varices,
and with collateralization of the right internal mammary vein to the
right portal vein. Nodular hepatic margin compatible with cirrhosis.
4. Celiac axis, porta hepatis, peripancreatic, and retroperitoneal
lymph nodes are mild to moderately enlarged. These could be reactive
or neoplastic.
5. The endometrium may measure up to 1.2 cm in diameter which is
thickened for age. Consider pelvic sonography for further workup to
exclude endometrial cancer.
6. Moderate splenomegaly.
7. Sigmoid colon diverticulosis.

## 2020-12-16 IMAGING — CR PORTABLE CHEST - 1 VIEW
1 series · 1 of 1 positions shown · non-contrast
Comparison: Chest CT-11/17/2018

CLINICAL DATA: Post left-sided Port a Catheter placement

EXAM:
PORTABLE CHEST 1 VIEW

[portable]
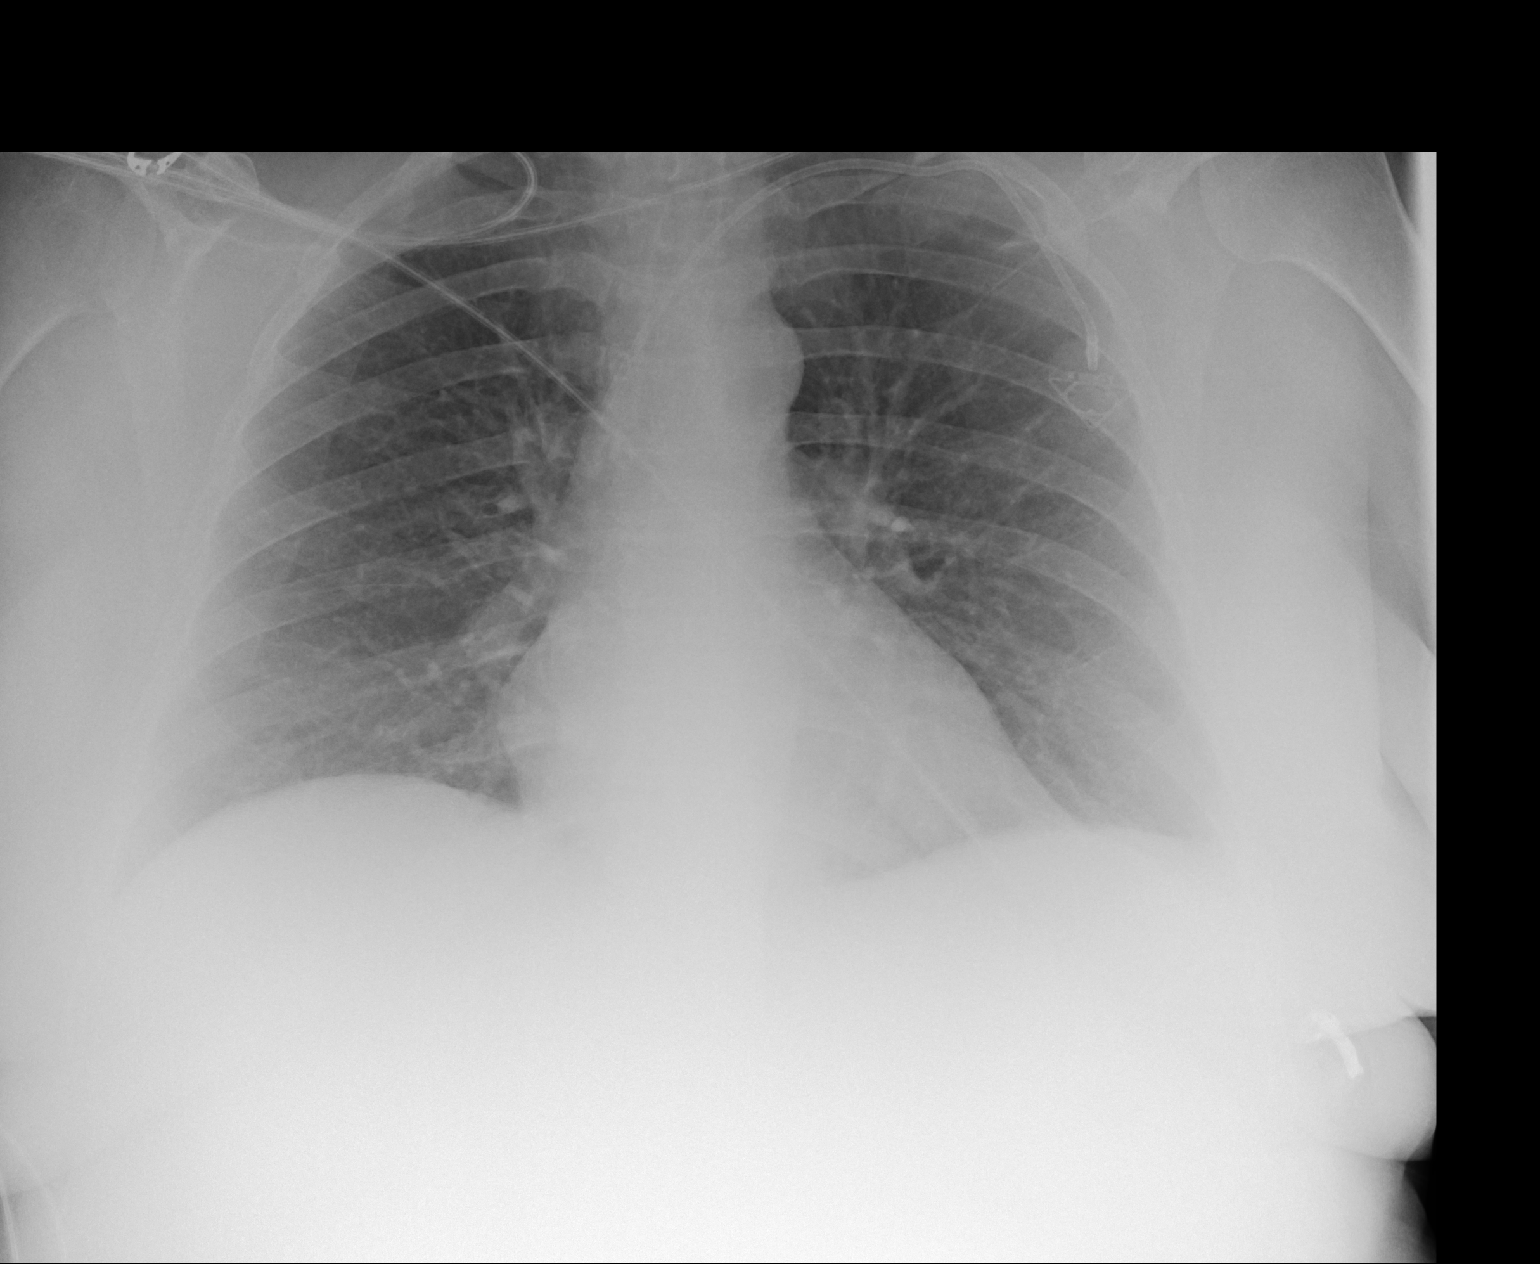

[1 of 1 positions shown; findings below may reference images not displayed]

FINDINGS: Interval placement of a left subclavian vein approach port a
catheter with tip projected over the superior SVC. No pneumothorax.

Normal cardiac silhouette and mediastinal contours. No focal
parenchymal opacities. No pleural effusion or pneumothorax. No
evidence of edema. No acute osseus abnormalities.
IMPRESSION: Post left-sided subclavian vein approach port a catheter with tip
projected over the SVC. No pneumothorax.
